# Patient Record
Sex: Male | Born: 1990 | Race: White | Hispanic: No | Marital: Single | Smoking: Current every day smoker
Health system: Southern US, Community
[De-identification: ages and names within clinical notes are randomized; demographics above are authoritative.]

## PROBLEM LIST (undated history)

## (undated) DIAGNOSIS — L03818 Cellulitis of other sites: Secondary | ICD-10-CM

## (undated) DIAGNOSIS — F319 Bipolar disorder, unspecified: Secondary | ICD-10-CM

## (undated) DIAGNOSIS — F200 Paranoid schizophrenia: Secondary | ICD-10-CM

## (undated) DIAGNOSIS — G40909 Epilepsy, unspecified, not intractable, without status epilepticus: Secondary | ICD-10-CM

## (undated) DIAGNOSIS — F329 Major depressive disorder, single episode, unspecified: Secondary | ICD-10-CM

## (undated) DIAGNOSIS — F32A Depression, unspecified: Secondary | ICD-10-CM

## (undated) DIAGNOSIS — R569 Unspecified convulsions: Secondary | ICD-10-CM

## (undated) HISTORY — PX: ORIF WRIST FRACTURE: SHX2133

---
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
## 2011-03-01 ENCOUNTER — Emergency Department (HOSPITAL_COMMUNITY)
Admission: EM | Admit: 2011-03-01 | Discharge: 2011-03-02 | Disposition: A | Discharge status: Home / Self Care | Payer: Medicaid Other | Attending: Emergency Medicine | Admitting: Emergency Medicine

## 2011-03-01 ENCOUNTER — Emergency Department (HOSPITAL_COMMUNITY): Payer: Medicaid Other

## 2011-03-01 DIAGNOSIS — Z8659 Personal history of other mental and behavioral disorders: Secondary | ICD-10-CM | POA: Insufficient documentation

## 2011-03-01 DIAGNOSIS — G8929 Other chronic pain: Secondary | ICD-10-CM | POA: Insufficient documentation

## 2011-03-01 DIAGNOSIS — R569 Unspecified convulsions: Secondary | ICD-10-CM | POA: Insufficient documentation

## 2011-03-01 DIAGNOSIS — Z79899 Other long term (current) drug therapy: Secondary | ICD-10-CM | POA: Insufficient documentation

## 2011-03-01 DIAGNOSIS — J329 Chronic sinusitis, unspecified: Secondary | ICD-10-CM | POA: Insufficient documentation

## 2011-03-01 DIAGNOSIS — M549 Dorsalgia, unspecified: Secondary | ICD-10-CM | POA: Insufficient documentation

## 2011-03-01 DIAGNOSIS — F319 Bipolar disorder, unspecified: Secondary | ICD-10-CM | POA: Insufficient documentation

## 2011-03-01 LAB — CBC
HCT: 46.1 % (ref 39.0–52.0)
Hemoglobin: 16.6 g/dL (ref 13.0–17.0)
MCH: 33.1 pg (ref 26.0–34.0)
MCHC: 36 g/dL (ref 30.0–36.0)
MCV: 91.8 fL (ref 78.0–100.0)
RDW: 12.5 % (ref 11.5–15.5)

## 2011-03-01 LAB — DIFFERENTIAL
Eosinophils Relative: 1 % (ref 0–5)
Lymphocytes Relative: 6 % — ABNORMAL LOW (ref 12–46)
Lymphs Abs: 1.4 10*3/uL (ref 0.7–4.0)
Monocytes Absolute: 0.8 10*3/uL (ref 0.1–1.0)
Monocytes Relative: 4 % (ref 3–12)
Neutro Abs: 19.8 10*3/uL — ABNORMAL HIGH (ref 1.7–7.7)

## 2011-03-01 LAB — URINALYSIS, ROUTINE W REFLEX MICROSCOPIC
Bilirubin Urine: NEGATIVE
Ketones, ur: NEGATIVE mg/dL
Nitrite: NEGATIVE
Specific Gravity, Urine: 1.019 (ref 1.005–1.030)
Urobilinogen, UA: 0.2 mg/dL (ref 0.0–1.0)
pH: 7 (ref 5.0–8.0)

## 2011-03-01 LAB — COMPREHENSIVE METABOLIC PANEL
ALT: 16 U/L (ref 0–53)
BUN: 9 mg/dL (ref 6–23)
CO2: 28 mEq/L (ref 19–32)
Calcium: 9.6 mg/dL (ref 8.4–10.5)
GFR calc non Af Amer: >60 mL/min (ref >60)
Glucose, Bld: 122 mg/dL — ABNORMAL HIGH (ref 70–99)
Sodium: 134 mEq/L — ABNORMAL LOW (ref 135–145)
Total Protein: 7.4 g/dL (ref 6.0–8.3)

## 2011-03-01 LAB — ETHANOL: Alcohol, Ethyl (B): <11 mg/dL — ABNORMAL HIGH (ref 0–10)

## 2011-03-02 ENCOUNTER — Inpatient Hospital Stay (HOSPITAL_COMMUNITY)
Admit: 2011-03-02 | Discharge: 2011-03-02 | Disposition: A | Discharge status: Home / Self Care | Payer: Medicaid Other | Attending: Emergency Medicine | Admitting: Emergency Medicine

## 2011-03-02 LAB — RAPID URINE DRUG SCREEN, HOSP PERFORMED
Amphetamines: NOT DETECTED
Barbiturates: NOT DETECTED
Benzodiazepines: NOT DETECTED
Cocaine: NOT DETECTED
Opiates: NOT DETECTED
Tetrahydrocannabinol: NOT DETECTED

## 2011-03-02 LAB — ETHANOL: Alcohol, Ethyl (B): <11 mg/dL — ABNORMAL HIGH (ref 0–10)

## 2011-03-02 NOTE — Procedures (Signed)
REFERRING PHYSICIAN:  Orlene Och, MD  HISTORY:  An 20 year old male with new-onset seizures.  MEDICATIONS:  Amitriptyline, fluoxetine, Seroquel, Xanax, Zofran, Ativan and Dilantin.  CONDITIONS OF RECORDING:  This is a 16-channel EEG carried out with the patient in the awake, drowsy and asleep states.  DESCRIPTION:  The waking background activity is only seen briefly and consists of a poorly sustained 10 Hz alpha activity seen from the parieto-occipital and posterotemporal regions.  Low-voltage fast activity poorly organized was seen anteriorly and at times superimposed on more posterior rhythms.  A mixture of theta and alpha rhythms can be seen from the central and temporal regions.  The patient drowses with slowing to irregular low-voltage theta and beta activity.  The patient goes into a light sleep with symmetrical sleep spindles, everted sharp activity and irregular slow activity.  During drowse, there is one instance where a sharp transient is noted over the right hemisphere with phase reversal at [facility].  Hypoventilation and intermittent photic stimulation were both performed.  Neither produced any abnormalities.  IMPRESSION:  This is an abnormal EEG with sharp transient noted at F4. This finding is consistent with the patient's history of seizure.          ______________________________ Thana Farr, MD    [id number] D:  03/02/2011 10:55:02  T:  03/02/2011 23:48:25  Job #:  [id number]

## 2011-03-25 ENCOUNTER — Emergency Department (HOSPITAL_COMMUNITY)
Admission: EM | Admit: 2011-03-25 | Discharge: 2011-03-26 | Disposition: A | Discharge status: Home / Self Care | Payer: Medicaid Other | Attending: Emergency Medicine | Admitting: Emergency Medicine

## 2011-03-25 DIAGNOSIS — Z79899 Other long term (current) drug therapy: Secondary | ICD-10-CM | POA: Insufficient documentation

## 2011-03-25 DIAGNOSIS — G40909 Epilepsy, unspecified, not intractable, without status epilepticus: Secondary | ICD-10-CM | POA: Insufficient documentation

## 2011-03-25 DIAGNOSIS — F319 Bipolar disorder, unspecified: Secondary | ICD-10-CM | POA: Insufficient documentation

## 2011-03-25 DIAGNOSIS — F209 Schizophrenia, unspecified: Secondary | ICD-10-CM | POA: Insufficient documentation

## 2011-03-25 DIAGNOSIS — F411 Generalized anxiety disorder: Secondary | ICD-10-CM | POA: Insufficient documentation

## 2011-03-25 DIAGNOSIS — R000 Tachycardia, unspecified: Secondary | ICD-10-CM | POA: Insufficient documentation

## 2011-03-25 LAB — BASIC METABOLIC PANEL
Chloride: 100 mEq/L (ref 96–112)
GFR calc Af Amer: >60 mL/min (ref >60)
GFR calc non Af Amer: >60 mL/min (ref >60)
Potassium: 3.6 mEq/L (ref 3.5–5.1)
Sodium: 137 mEq/L (ref 135–145)

## 2011-03-25 LAB — ACETAMINOPHEN LEVEL: Acetaminophen (Tylenol), Serum: <15 ug/mL (ref 10–30)

## 2011-03-25 LAB — SALICYLATE LEVEL: Salicylate Lvl: <2 mg/dL — ABNORMAL LOW (ref 2.8–20.0)

## 2011-03-26 LAB — RAPID URINE DRUG SCREEN, HOSP PERFORMED
Cocaine: NOT DETECTED
Opiates: NOT DETECTED

## 2011-04-03 ENCOUNTER — Other Ambulatory Visit: Payer: Self-pay | Admitting: Neurology

## 2011-04-03 DIAGNOSIS — F39 Unspecified mood [affective] disorder: Secondary | ICD-10-CM

## 2011-04-03 DIAGNOSIS — F1910 Other psychoactive substance abuse, uncomplicated: Secondary | ICD-10-CM

## 2011-04-03 DIAGNOSIS — R569 Unspecified convulsions: Secondary | ICD-10-CM

## 2011-04-03 DIAGNOSIS — F3110 Bipolar disorder, current episode manic without psychotic features, unspecified: Secondary | ICD-10-CM

## 2011-04-20 ENCOUNTER — Ambulatory Visit
Admission: RE | Admit: 2011-04-20 | Discharge: 2011-04-20 | Disposition: A | Discharge status: Home / Self Care | Payer: Medicaid Other | Source: Ambulatory Visit | Attending: Neurology | Admitting: Neurology

## 2011-04-20 DIAGNOSIS — F3110 Bipolar disorder, current episode manic without psychotic features, unspecified: Secondary | ICD-10-CM

## 2011-04-20 DIAGNOSIS — F1910 Other psychoactive substance abuse, uncomplicated: Secondary | ICD-10-CM

## 2011-04-20 DIAGNOSIS — F39 Unspecified mood [affective] disorder: Secondary | ICD-10-CM

## 2011-04-20 DIAGNOSIS — R569 Unspecified convulsions: Secondary | ICD-10-CM

## 2011-05-11 ENCOUNTER — Other Ambulatory Visit (HOSPITAL_COMMUNITY): Payer: Medicaid Other

## 2011-05-16 ENCOUNTER — Other Ambulatory Visit (HOSPITAL_COMMUNITY): Payer: Medicaid Other

## 2012-04-14 ENCOUNTER — Emergency Department (INDEPENDENT_AMBULATORY_CARE_PROVIDER_SITE_OTHER)
Admission: EM | Admit: 2012-04-14 | Discharge: 2012-04-14 | Disposition: A | Discharge status: Home / Self Care | Payer: Medicaid Other | Source: Home / Self Care | Attending: Emergency Medicine | Admitting: Emergency Medicine

## 2012-04-14 ENCOUNTER — Encounter (HOSPITAL_COMMUNITY): Payer: Self-pay | Admitting: *Deleted

## 2012-04-14 DIAGNOSIS — IMO0002 Reserved for concepts with insufficient information to code with codable children: Secondary | ICD-10-CM

## 2012-04-14 DIAGNOSIS — L02419 Cutaneous abscess of limb, unspecified: Secondary | ICD-10-CM

## 2012-04-14 HISTORY — DX: Depression, unspecified: F32.A

## 2012-04-14 HISTORY — DX: Epilepsy, unspecified, not intractable, without status epilepticus: G40.909

## 2012-04-14 HISTORY — DX: Major depressive disorder, single episode, unspecified: F32.9

## 2012-04-14 HISTORY — DX: Paranoid schizophrenia: F20.0

## 2012-04-14 HISTORY — DX: Bipolar disorder, unspecified: F31.9

## 2012-04-14 MED ORDER — SULFAMETHOXAZOLE-TRIMETHOPRIM 800-160 MG PO TABS: 1.0000 | ORAL_TABLET | Freq: Two times a day (BID) | ORAL | Status: AC

## 2012-04-14 MED ORDER — HYDROCODONE-ACETAMINOPHEN 5-325 MG PO TABS: 2.0000 | ORAL_TABLET | ORAL | Status: AC | PRN

## 2012-04-14 MED ORDER — CHLORHEXIDINE GLUCONATE 4 % EX LIQD: 60.0000 mL | Freq: Every day | CUTANEOUS | Status: AC | PRN

## 2012-04-14 MED ORDER — BACITRACIN 500 UNIT/GM EX OINT
1.0000 "application " | TOPICAL_OINTMENT | Freq: Once | CUTANEOUS | Status: AC
  Administered 2012-04-14: 1 via TOPICAL

## 2012-04-14 MED ORDER — IBUPROFEN 600 MG PO TABS: 600.0000 mg | ORAL_TABLET | Freq: Four times a day (QID) | ORAL | Status: AC | PRN

## 2012-04-14 MED ORDER — LIDOCAINE-EPINEPHRINE 2 %-1:100000 IJ SOLN
5.0000 mL | Freq: Once | INTRAMUSCULAR | Status: AC
  Administered 2012-04-14: 5 mL

## 2012-04-14 NOTE — ED Provider Notes (Signed)
History     CSN: [id number]  Arrival date & time 04/14/12  1133   First MD Initiated Contact with Patient 04/14/12 1155      Chief Complaint  Patient presents with  . Abscess    (Consider location/radiation/quality/duration/timing/severity/associated sxs/prior treatment) HPI Comments: Pt with painful erythematous mass of gradually increasing size in his left axilla starting 2 days ago. Pain is worse with palpation. Has been applying cool compresses, Bactroban, alcohol w/o relief. No N/V, fevers, drainage. No other lesions anywhere else.  Has not tried anything for pain. Has a history of axillary abscess the right side several years ago. He is not a diabetic.  ROS as noted in HPI. All other ROS negative.   Patient is a 21 y.o. male presenting with abscess.  Abscess  This is a new problem. The current episode started yesterday. The problem has been gradually worsening. Affected Location: Left axilla. The problem is moderate. The abscess is characterized by redness and painfulness. It is unknown what he was exposed to. The abscess first occurred at home. Pertinent negatives include no fever.    Past Medical History  Diagnosis Date  . Paranoid schizophrenia   . Seizure disorder   . Bipolar disorder   . Depression     Past Surgical History  Procedure Date  . Orif wrist fracture     History reviewed. No pertinent family history.  History  Substance Use Topics  . Smoking status: Never Smoker   . Smokeless tobacco: Not on file  . Alcohol Use: No      Review of Systems  Constitutional: Negative for fever.    Allergies  Amoxicillin and Penicillins  Home Medications   Current Outpatient Rx  Name Route Sig Dispense Refill  . PHENYTOIN SODIUM EXTENDED 100 MG PO CAPS Oral Take by mouth daily.    . QUETIAPINE FUMARATE 200 MG PO TABS Oral Take 200 mg by mouth 3 (three) times daily.    . CHLORHEXIDINE GLUCONATE 4 % EX LIQD Topical Apply 60 mLs (4 application total)  topically daily as needed. Use daily when bathing for 1-2 weeks 120 mL 0  . HYDROCODONE-ACETAMINOPHEN 5-325 MG PO TABS Oral Take 2 tablets by mouth every 4 (four) hours as needed for pain. 20 tablet 0  . IBUPROFEN 600 MG PO TABS Oral Take 1 tablet (600 mg total) by mouth every 6 (six) hours as needed for pain. 30 tablet 0  . SULFAMETHOXAZOLE-TRIMETHOPRIM 800-160 MG PO TABS Oral Take 1 tablet by mouth 2 (two) times daily. X 10 days 20 tablet 0    BP 134/90  Pulse 91  Temp 98.2 F (36.8 C) (Oral)  Resp 16  SpO2 96%  Physical Exam  Nursing note and vitals reviewed. Constitutional: He is oriented to person, place, and time. He appears well-developed and well-nourished.  HENT:  Head: Normocephalic and atraumatic.  Eyes: Conjunctivae and EOM are normal.  Neck: Normal range of motion.  Cardiovascular: Normal rate.   Pulmonary/Chest: Effort normal. No respiratory distress.  Abdominal: He exhibits no distension.  Musculoskeletal: Normal range of motion.  Neurological: He is alert and oriented to person, place, and time. Coordination normal.  Skin: Skin is warm and dry.       1.5 x 3 cm erythematous, tender mass with central fluctuance and left axilla. No expressible drainage. Some surrounding axillary lymphadenopathy.  Psychiatric: He has a normal mood and affect. His behavior is normal. Judgment and thought content normal.    ED Course  INCISION AND  DRAINAGE Date/Time: 04/14/2012 1:17 PM Performed by: Luiz Blare Authorized by: Luiz Blare Consent: Verbal consent obtained. Risks and benefits: risks, benefits and alternatives were discussed Consent given by: patient Patient understanding: patient states understanding of the procedure being performed Patient consent: the patient's understanding of the procedure matches consent given Required items: required blood products, implants, devices, and special equipment available Patient identity confirmed: verbally with  patient Time out: Immediately prior to procedure a "time out" was called to verify the correct patient, procedure, equipment, support staff and site/side marked as required. Type: abscess Body area: upper extremity (Left axilla) Anesthesia: local infiltration Local anesthetic: lidocaine 2% with epinephrine Anesthetic total: 3 ml Patient sedated: no Scalpel size: 11 Incision type: Cruciate. Complexity: simple Drainage: purulent Drainage amount: copious Wound treatment: wound left open Patient tolerance: Patient tolerated the procedure well with no immediate complications. Comments: Perform blunt dissection to break up loculations. Obtained deep wound culture. Sending this off. Irrigated the cavity with 8 mL of lidocaine with epi 2%. Applied bacitracin and sterile pressure dressing.   (including critical care time)   Labs Reviewed  CULTURE, ROUTINE-ABSCESS   No results found.   1. Axillary abscess      MDM  With Bactrim, ibuprofen, Norco. He'll followup here or with his Dr. in several days for a wound check if he does not significantly improve. Patient and parent agreed with plan.  Luiz Blare, MD 04/14/12 1319

## 2012-04-14 NOTE — ED Notes (Signed)
C/O left axillary abscess x 2 days.  Redness noted.  Denies fevers.  Has had axillary abscess once in past, approx 4-5 yrs ago.

## 2012-04-16 LAB — CULTURE, ROUTINE-ABSCESS

## 2012-04-18 ENCOUNTER — Telehealth (HOSPITAL_COMMUNITY): Payer: Self-pay | Admitting: *Deleted

## 2012-04-18 NOTE — ED Notes (Signed)
Abscess culture L axilla:  Abundant MRSA.  Pt. adequately treated with Septra DS. I called and left a message for pt. to call. Vassie Moselle 04/18/2012

## 2012-04-24 ENCOUNTER — Telehealth (HOSPITAL_COMMUNITY): Payer: Self-pay | Admitting: *Deleted

## 2012-04-25 ENCOUNTER — Telehealth (HOSPITAL_COMMUNITY): Payer: Self-pay | Admitting: *Deleted

## 2012-04-25 NOTE — ED Notes (Signed)
I called pt.  Pt. verified x 2 and given result.  Pt. told he was adequately treated with Septra DS.  Pt. Given MRSA instructions and voiced understanding. Vassie Moselle 04/25/2012

## 2012-04-26 ENCOUNTER — Telehealth (HOSPITAL_COMMUNITY): Payer: Self-pay | Admitting: *Deleted

## 2012-04-26 NOTE — ED Notes (Signed)
Male family member called and said someone from here called earlier today about billing. I asked Clyda Hurdle if she had called and she did not.  He said we should be billing Medicaid. I referred him to the billing office @ [phone]. I told him I had called earlier, but it was to give the pt. his lab results. Joel Randolph 04/26/2012

## 2012-05-16 ENCOUNTER — Encounter (HOSPITAL_COMMUNITY): Payer: Self-pay | Admitting: Emergency Medicine

## 2012-05-16 ENCOUNTER — Emergency Department (HOSPITAL_COMMUNITY)
Admission: EM | Admit: 2012-05-16 | Discharge: 2012-05-16 | Disposition: A | Discharge status: Home / Self Care | Payer: Medicaid Other | Attending: Emergency Medicine | Admitting: Emergency Medicine

## 2012-05-16 DIAGNOSIS — F319 Bipolar disorder, unspecified: Secondary | ICD-10-CM | POA: Insufficient documentation

## 2012-05-16 DIAGNOSIS — Z880 Allergy status to penicillin: Secondary | ICD-10-CM | POA: Insufficient documentation

## 2012-05-16 DIAGNOSIS — R7889 Finding of other specified substances, not normally found in blood: Secondary | ICD-10-CM

## 2012-05-16 DIAGNOSIS — E876 Hypokalemia: Secondary | ICD-10-CM

## 2012-05-16 DIAGNOSIS — F200 Paranoid schizophrenia: Secondary | ICD-10-CM | POA: Insufficient documentation

## 2012-05-16 DIAGNOSIS — R29818 Other symptoms and signs involving the nervous system: Secondary | ICD-10-CM

## 2012-05-16 DIAGNOSIS — G40909 Epilepsy, unspecified, not intractable, without status epilepticus: Secondary | ICD-10-CM | POA: Insufficient documentation

## 2012-05-16 HISTORY — DX: Unspecified convulsions: R56.9

## 2012-05-16 LAB — COMPREHENSIVE METABOLIC PANEL
AST: 20 U/L (ref 0–37)
Albumin: 4.1 g/dL (ref 3.5–5.2)
BUN: 9 mg/dL (ref 6–23)
Chloride: 100 mEq/L (ref 96–112)
GFR calc Af Amer: >90 mL/min (ref >90)
GFR calc non Af Amer: >90 mL/min (ref >90)
Potassium: 3.2 mEq/L — ABNORMAL LOW (ref 3.5–5.1)

## 2012-05-16 LAB — RAPID URINE DRUG SCREEN, HOSP PERFORMED
Amphetamines: NOT DETECTED
Benzodiazepines: POSITIVE — AB
Cocaine: NOT DETECTED
Opiates: NOT DETECTED

## 2012-05-16 LAB — CBC WITH DIFFERENTIAL/PLATELET
HCT: 44.9 % (ref 39.0–52.0)
Hemoglobin: 16.5 g/dL (ref 13.0–17.0)
Lymphocytes Relative: 26 % (ref 12–46)
Lymphs Abs: 2.5 10*3/uL (ref 0.7–4.0)
Monocytes Absolute: 0.8 10*3/uL (ref 0.1–1.0)
Monocytes Relative: 9 % (ref 3–12)
Neutro Abs: 5.9 10*3/uL (ref 1.7–7.7)
Neutrophils Relative %: 61 % (ref 43–77)
RBC: 5.04 MIL/uL (ref 4.22–5.81)

## 2012-05-16 LAB — URINALYSIS, ROUTINE W REFLEX MICROSCOPIC
Glucose, UA: NEGATIVE mg/dL
Hgb urine dipstick: NEGATIVE
Leukocytes, UA: NEGATIVE
Protein, ur: NEGATIVE mg/dL
Specific Gravity, Urine: 1.026 (ref 1.005–1.030)
pH: 6 (ref 5.0–8.0)

## 2012-05-16 LAB — GLUCOSE, CAPILLARY: Glucose-Capillary: 99 mg/dL (ref 70–99)

## 2012-05-16 MED ORDER — DIPHENHYDRAMINE HCL 50 MG/ML IJ SOLN
25.0000 mg | Freq: Once | INTRAMUSCULAR | Status: AC
  Administered 2012-05-16: 25 mg via INTRAVENOUS
  Filled 2012-05-16: qty 1

## 2012-05-16 MED ORDER — POTASSIUM CHLORIDE CRYS ER 20 MEQ PO TBCR
40.0000 meq | EXTENDED_RELEASE_TABLET | Freq: Once | ORAL | Status: AC
  Administered 2012-05-16: 40 meq via ORAL
  Filled 2012-05-16: qty 2

## 2012-05-16 MED ORDER — PHENYTOIN SODIUM EXTENDED 100 MG PO CAPS
200.0000 mg | ORAL_CAPSULE | Freq: Once | ORAL | Status: AC
  Administered 2012-05-16: 200 mg via ORAL
  Filled 2012-05-16: qty 2

## 2012-05-16 MED ORDER — SODIUM CHLORIDE 0.9 % IV SOLN
Freq: Once | INTRAVENOUS | Status: AC
  Administered 2012-05-16: 02:00:00 via INTRAVENOUS

## 2012-05-16 MED ORDER — SODIUM CHLORIDE 0.9 % IV SOLN
1000.0000 mg | Freq: Once | INTRAVENOUS | Status: DC
  Administered 2012-05-16: 1000 mg via INTRAVENOUS
  Filled 2012-05-16: qty 20

## 2012-05-16 MED ORDER — MORPHINE SULFATE 4 MG/ML IJ SOLN
4.0000 mg | Freq: Once | INTRAMUSCULAR | Status: AC
  Administered 2012-05-16: 4 mg via INTRAVENOUS
  Filled 2012-05-16: qty 1

## 2012-05-16 NOTE — ED Provider Notes (Signed)
History     CSN: [id number]  Arrival date & time 05/16/12  0049   First MD Initiated Contact with Patient 05/16/12 [phone]      Chief Complaint  Patient presents with  . Seizures    (Consider location/radiation/quality/duration/timing/severity/associated sxs/prior treatment) HPI Comments: Patient is a 21 year old male with a history of epileptic seizures, paranoid schizophrenia, bipolar disorder, and depression that presents emergency department with chief complaint of aura.  History is obtained from both patient and his family.  They report that the patient typically develops diaphoresis, heart palpitations, head tingles and funny taste in his mouth prior to seizures which he developed at around 11 p.m. this evening.  At that time EMS was called and gave him a shot of Ativan.  Patient states that he did not develop a seizure.  Patient recently  change the way he takes his Dilantin originally taking it 3 pills at night and then today began taking it 3 times throughout the day.  Patient denies any cough, fever, night sweats, chills, cough, hemoptysis, difficulty breathing, chest pain, urinary symptoms.  Patient does report increased fatigue and decrease in sleep.  There are no Stressors.  No other complaints at this time.    Patient is a 21 y.o. male presenting with seizures.  Seizures     Past Medical History  Diagnosis Date  . Paranoid schizophrenia   . Seizure disorder   . Bipolar disorder   . Depression   . Seizures     Past Surgical History  Procedure Date  . Orif wrist fracture     History reviewed. No pertinent family history.  History  Substance Use Topics  . Smoking status: Never Smoker   . Smokeless tobacco: Not on file  . Alcohol Use: No      Review of Systems  All other systems reviewed and are negative.    Allergies  Amoxicillin and Penicillins  Home Medications   Current Outpatient Rx  Name Route Sig Dispense Refill  . ALPRAZOLAM 1 MG PO TABS Oral  Take 1 mg by mouth at bedtime as needed. Nausea    . PHENYTOIN SODIUM EXTENDED 100 MG PO CAPS Oral Take 100 mg by mouth 3 (three) times daily.     . QUETIAPINE FUMARATE 200 MG PO TABS Oral Take 200 mg by mouth 3 (three) times daily.       BP 139/69  Pulse 84  Temp 98.2 F (36.8 C) (Oral)  Resp 16  Wt 153 lb (69.4 kg)  SpO2 99%  Physical Exam  Nursing note and vitals reviewed. Constitutional: He appears well-developed and well-nourished. No distress.          HENT:  Head: Normocephalic.       Poor dentition. Atraumatic, No evidence of tongue oral lacerations  Eyes: EOM are normal. Pupils are equal, round, and reactive to light.  Neck: Normal range of motion. Neck supple.       Cervical spinous process non tender without step offs, no difficulty or pain with flexion or extension of neck  Cardiovascular: Regular rhythm, normal heart sounds and intact distal pulses.        tachy  Pulmonary/Chest: Breath sounds normal. No respiratory distress. He has no wheezes. He has no rales.  Abdominal: Soft. There is no tenderness.  Musculoskeletal: He exhibits no edema and no tenderness.       Full active & passive ROM of arms bilaterally  Neurological:       CN III-VII intact. Iintact coordination,  sensation, and motor (finger grip, biceps, hamstrings & dorsiflexion). No pass pointing, good rapid coordination. Gait normal.   Skin: Skin is warm and dry. He is not diaphoretic.       intact    ED Course  Procedures (including critical care time)  Labs Reviewed  COMPREHENSIVE METABOLIC PANEL - Abnormal; Notable for the following:    Potassium 3.2 (*)     Total Bilirubin 0.2 (*)     All other components within normal limits  CBC WITH DIFFERENTIAL - Abnormal; Notable for the following:    MCHC 36.7 (*)     All other components within normal limits  PHENYTOIN LEVEL, TOTAL - Abnormal; Notable for the following:    Phenytoin Lvl 4.5 (*)     All other components within normal limits  URINE  RAPID DRUG SCREEN (HOSP PERFORMED) - Abnormal; Notable for the following:    Benzodiazepines POSITIVE (*)     All other components within normal limits  URINALYSIS, ROUTINE W REFLEX MICROSCOPIC  ETHANOL  GLUCOSE, CAPILLARY   No results found.   No diagnosis found.    MDM  Seizure Hx, aura   Patient with no evidence of focal neuro deficits on physical exam and is at mental baseline.  Labs and imaging have been reviewed.  Dilantin level low, patient given loading dose & potassium in the emergency department. Patient is advised to followup with neurologist in regards to today's event.  Spoke with patient and family in detail about driving restrictions until cleared by a neurologist.  Patient verbalizes understanding.  Answered all questions.  Patient is hemodynamically stable and in no acute distress prior to discharge.           Jaci Carrel, [place] 05/16/12 [phone]

## 2012-05-16 NOTE — ED Notes (Addendum)
Pt states that his Dilantin has been changed from 3 pills at night to 3 pills through the day and has felt seizure like activity today. Heart racing. Tingling down his arms. CBG 130. 18g L arm.

## 2012-05-16 NOTE — ED Notes (Signed)
[id number]<VW> Expected date:<BR> Expected time:<BR> Means of arrival:Ambulance<BR> Comments:<BR> EMS

## 2012-05-16 NOTE — ED Notes (Signed)
Pt alert, arrives from home, c/o ? Seizure, onset was this evening, lasting 1hr, per family pt was sweating, resp even unlabored, skin pwd, pt did not lose bowel or bladder control

## 2012-05-17 NOTE — ED Provider Notes (Signed)
Medical screening examination/treatment/procedure(s) were performed by non-physician practitioner and as supervising physician I was immediately available for consultation/collaboration.  Kaleisha Bhargava, MD 05/17/12 0847 

## 2012-11-13 ENCOUNTER — Emergency Department (HOSPITAL_COMMUNITY)
Admission: EM | Admit: 2012-11-13 | Discharge: 2012-11-13 | Disposition: A | Discharge status: Home / Self Care | Payer: Medicaid Other | Attending: Emergency Medicine | Admitting: Emergency Medicine

## 2012-11-13 ENCOUNTER — Emergency Department (HOSPITAL_COMMUNITY): Payer: Medicaid Other

## 2012-11-13 ENCOUNTER — Encounter (HOSPITAL_COMMUNITY): Payer: Self-pay | Admitting: *Deleted

## 2012-11-13 DIAGNOSIS — Y93K1 Activity, walking an animal: Secondary | ICD-10-CM | POA: Insufficient documentation

## 2012-11-13 DIAGNOSIS — S61459A Open bite of unspecified hand, initial encounter: Secondary | ICD-10-CM

## 2012-11-13 DIAGNOSIS — F329 Major depressive disorder, single episode, unspecified: Secondary | ICD-10-CM | POA: Insufficient documentation

## 2012-11-13 DIAGNOSIS — F319 Bipolar disorder, unspecified: Secondary | ICD-10-CM | POA: Insufficient documentation

## 2012-11-13 DIAGNOSIS — Z203 Contact with and (suspected) exposure to rabies: Secondary | ICD-10-CM

## 2012-11-13 DIAGNOSIS — S61409A Unspecified open wound of unspecified hand, initial encounter: Secondary | ICD-10-CM | POA: Insufficient documentation

## 2012-11-13 DIAGNOSIS — F3289 Other specified depressive episodes: Secondary | ICD-10-CM | POA: Insufficient documentation

## 2012-11-13 DIAGNOSIS — Y9289 Other specified places as the place of occurrence of the external cause: Secondary | ICD-10-CM | POA: Insufficient documentation

## 2012-11-13 DIAGNOSIS — Z79899 Other long term (current) drug therapy: Secondary | ICD-10-CM | POA: Insufficient documentation

## 2012-11-13 DIAGNOSIS — Z8669 Personal history of other diseases of the nervous system and sense organs: Secondary | ICD-10-CM | POA: Insufficient documentation

## 2012-11-13 DIAGNOSIS — Z23 Encounter for immunization: Secondary | ICD-10-CM | POA: Insufficient documentation

## 2012-11-13 DIAGNOSIS — F200 Paranoid schizophrenia: Secondary | ICD-10-CM | POA: Insufficient documentation

## 2012-11-13 DIAGNOSIS — W540XXA Bitten by dog, initial encounter: Secondary | ICD-10-CM | POA: Insufficient documentation

## 2012-11-13 MED ORDER — LORAZEPAM 2 MG/ML IJ SOLN
1.0000 mg | Freq: Once | INTRAMUSCULAR | Status: AC
  Administered 2012-11-13: 1 mg via INTRAVENOUS
  Filled 2012-11-13: qty 1

## 2012-11-13 MED ORDER — TETANUS-DIPHTH-ACELL PERTUSSIS 5-2.5-18.5 LF-MCG/0.5 IM SUSP
0.5000 mL | Freq: Once | INTRAMUSCULAR | Status: AC
  Administered 2012-11-13: 0.5 mL via INTRAMUSCULAR
  Filled 2012-11-13: qty 0.5

## 2012-11-13 MED ORDER — ONDANSETRON HCL 4 MG/2ML IJ SOLN
INTRAMUSCULAR | Status: AC
  Administered 2012-11-13: 4 mg via INTRAVENOUS
  Filled 2012-11-13: qty 2

## 2012-11-13 MED ORDER — OXYCODONE-ACETAMINOPHEN 5-325 MG PO TABS: 1.0000 | ORAL_TABLET | Freq: Four times a day (QID) | ORAL | Status: DC | PRN

## 2012-11-13 MED ORDER — RABIES IMMUNE GLOBULIN 150 UNIT/ML IM INJ
20.0000 [IU]/kg | INJECTION | Freq: Once | INTRAMUSCULAR | Status: AC
  Administered 2012-11-13: 1425 [IU] via INTRAMUSCULAR
  Filled 2012-11-13: qty 9.5

## 2012-11-13 MED ORDER — DOXYCYCLINE HYCLATE 100 MG PO CAPS: 100.0000 mg | ORAL_CAPSULE | Freq: Two times a day (BID) | ORAL | Status: DC

## 2012-11-13 MED ORDER — HYDROMORPHONE HCL PF 1 MG/ML IJ SOLN
1.0000 mg | Freq: Once | INTRAMUSCULAR | Status: AC
  Administered 2012-11-13: 1 mg via INTRAVENOUS
  Filled 2012-11-13: qty 1

## 2012-11-13 MED ORDER — OXYCODONE-ACETAMINOPHEN 5-325 MG PO TABS: 2.0000 | ORAL_TABLET | Freq: Once | ORAL | Status: DC

## 2012-11-13 MED ORDER — ONDANSETRON HCL 4 MG/2ML IJ SOLN
4.0000 mg | Freq: Once | INTRAMUSCULAR | Status: AC
  Administered 2012-11-13: 4 mg via INTRAVENOUS

## 2012-11-13 MED ORDER — RABIES VACCINE, PCEC IM SUSR
1.0000 mL | Freq: Once | INTRAMUSCULAR | Status: AC
  Administered 2012-11-13: 1 mL via INTRAMUSCULAR
  Filled 2012-11-13: qty 1

## 2012-11-13 MED ORDER — DOXYCYCLINE HYCLATE 100 MG PO TABS
100.0000 mg | ORAL_TABLET | Freq: Once | ORAL | Status: AC
Start: 2012-11-13 — End: 2012-11-13
  Administered 2012-11-13: 100 mg via ORAL
  Filled 2012-11-13: qty 1
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
## 2012-11-13 NOTE — ED Notes (Addendum)
Pt received x 2 IM injections in the R anterior thigh each 2 mL Hyperab, pt received x 2 IM injections in the L anterior thigh each 2 mL Hyperab, pt received x 1.5 mL Hyperab in L anterior thigh, pt tolerated procedure well, pt given printed schedule for future immunoglobulin follow up injections

## 2012-11-13 NOTE — ED Provider Notes (Signed)
Medical screening examination/treatment/procedure(s) were performed by non-physician practitioner and as supervising physician I was immediately available for consultation/collaboration.   Carleene Cooper III, MD 11/13/12 2025

## 2012-11-13 NOTE — ED Notes (Signed)
Shot schedule for remainder of rabies vaccines completed and faxed to [facility], [facility] and to pharmacy x 2.  Bite report faxed to [facility].
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
## 2012-11-16 ENCOUNTER — Encounter (HOSPITAL_COMMUNITY): Payer: Self-pay | Admitting: Emergency Medicine

## 2012-11-16 ENCOUNTER — Emergency Department (INDEPENDENT_AMBULATORY_CARE_PROVIDER_SITE_OTHER)
Admission: EM | Admit: 2012-11-16 | Discharge: 2012-11-16 | Disposition: A | Discharge status: Home / Self Care | Payer: Medicaid Other | Source: Home / Self Care | Attending: Family Medicine | Admitting: Family Medicine

## 2012-11-16 DIAGNOSIS — Z203 Contact with and (suspected) exposure to rabies: Secondary | ICD-10-CM

## 2012-11-16 DIAGNOSIS — S61451D Open bite of right hand, subsequent encounter: Secondary | ICD-10-CM

## 2012-11-16 MED ORDER — RABIES VACCINE, PCEC IM SUSR
INTRAMUSCULAR | Status: AC
  Filled 2012-11-16: qty 1

## 2012-11-16 MED ORDER — RABIES VACCINE, PCEC IM SUSR
1.0000 mL | Freq: Once | INTRAMUSCULAR | Status: AC
  Administered 2012-11-16: 1 mL via INTRAMUSCULAR

## 2012-11-16 NOTE — ED Notes (Signed)
Pt is here for rabies injection and pt is requesting to see doctor because of pain from dog bites. Pt has no other concerns.

## 2012-11-16 NOTE — ED Provider Notes (Signed)
History     CSN: [id number]  Arrival date & time 11/16/12  1122   First MD Initiated Contact with Patient 11/16/12 1153      Chief Complaint  Patient presents with  . Rabies Injection    pt also requesting to see doc because of pain from dog bites.    (Consider location/radiation/quality/duration/timing/severity/associated sxs/prior treatment) Patient is a 22 y.o. male presenting with wound check. The history is provided by the patient and a parent.  Wound Check This is a new problem. The current episode started more than 2 days ago. The problem has been gradually improving. Associated symptoms comments: Dog bites and abrasions from 2/5 , here for 2nd rabies inj and recheck..    Past Medical History  Diagnosis Date  . Paranoid schizophrenia   . Seizure disorder   . Bipolar disorder   . Depression   . Seizures     Past Surgical History  Procedure Laterality Date  . Orif wrist fracture      History reviewed. No pertinent family history.  History  Substance Use Topics  . Smoking status: Never Smoker   . Smokeless tobacco: Not on file  . Alcohol Use: No      Review of Systems  Constitutional: Negative.   Skin: Positive for wound.    Allergies  Amoxicillin and Penicillins  Home Medications   Current Outpatient Rx  Name  Route  Sig  Dispense  Refill  . ALPRAZolam (XANAX) 1 MG tablet   Oral   Take 1 mg by mouth at bedtime as needed. Nausea         . doxycycline (VIBRAMYCIN) 100 MG capsule   Oral   Take 1 capsule (100 mg total) by mouth 2 (two) times daily.   20 capsule   0   . omeprazole (PRILOSEC) 20 MG capsule   Oral   Take 20 mg by mouth daily.         Marland Kitchen oxyCODONE-acetaminophen (PERCOCET/ROXICET) 5-325 MG per tablet   Oral   Take 1-2 tablets by mouth every 6 (six) hours as needed for pain.   20 tablet   0   . phenytoin (DILANTIN) 100 MG ER capsule   Oral   Take 200-300 mg by mouth 3 (three) times daily. 2 capsules by mouth in the morning  and 3 capsules in the evening.         Marland Kitchen QUEtiapine (SEROQUEL) 200 MG tablet   Oral   Take 200 mg by mouth 3 (three) times daily.          . sertraline (ZOLOFT) 50 MG tablet   Oral   Take 50 mg by mouth at bedtime.         . traZODone (DESYREL) 100 MG tablet   Oral   Take 100 mg by mouth at bedtime as needed. As needed for insomnia.         Marland Kitchen albuterol (PROVENTIL HFA;VENTOLIN HFA) 108 (90 BASE) MCG/ACT inhaler   Inhalation   Inhale 2 puffs into the lungs every 6 (six) hours as needed. As needed for shortness of breath.           BP 118/79  Pulse 84  Temp(Src) 98.6 F (37 C) (Oral)  SpO2 99%  Physical Exam  Nursing note and vitals reviewed. Constitutional: He is oriented to person, place, and time. He appears well-developed and well-nourished.  Musculoskeletal: Normal range of motion. He exhibits tenderness.  Neurological: He is alert and oriented to person, place, and  time.  Skin: Skin is warm and dry. No erythema.  All wounds healing well, no cellulitis or drainage.    ED Course  Procedures (including critical care time)  Labs Reviewed - No data to display No results found.   1. Dog bite of hand without complication, right, subsequent encounter       MDM  Wounds healing well, no signs of infection.       Linna Hoff, MD 11/16/12 [phone]

## 2012-11-20 ENCOUNTER — Encounter (HOSPITAL_COMMUNITY): Payer: Self-pay | Admitting: *Deleted

## 2012-11-20 ENCOUNTER — Emergency Department (INDEPENDENT_AMBULATORY_CARE_PROVIDER_SITE_OTHER)
Admission: EM | Admit: 2012-11-20 | Discharge: 2012-11-20 | Disposition: A | Discharge status: Home / Self Care | Payer: Medicaid Other | Source: Home / Self Care | Attending: Emergency Medicine | Admitting: Emergency Medicine

## 2012-11-20 DIAGNOSIS — Z203 Contact with and (suspected) exposure to rabies: Secondary | ICD-10-CM

## 2012-11-20 MED ORDER — RABIES VACCINE, PCEC IM SUSR
INTRAMUSCULAR | Status: AC
  Filled 2012-11-20: qty 1

## 2012-11-20 MED ORDER — RABIES VACCINE, PCEC IM SUSR
1.0000 mL | Freq: Once | INTRAMUSCULAR | Status: AC
Start: 2012-11-20 — End: 2012-11-20
  Administered 2012-11-20: 1 mL via INTRAMUSCULAR

## 2012-11-20 NOTE — ED Notes (Signed)
For Rabies vaccine #3.

## 2012-11-27 ENCOUNTER — Encounter (HOSPITAL_COMMUNITY): Payer: Self-pay

## 2012-11-27 ENCOUNTER — Emergency Department (INDEPENDENT_AMBULATORY_CARE_PROVIDER_SITE_OTHER)
Admission: EM | Admit: 2012-11-27 | Discharge: 2012-11-27 | Disposition: A | Discharge status: Home / Self Care | Payer: Medicaid Other | Source: Home / Self Care

## 2012-11-27 DIAGNOSIS — Z23 Encounter for immunization: Secondary | ICD-10-CM

## 2012-11-27 MED ORDER — RABIES VACCINE, PCEC IM SUSR
1.0000 mL | Freq: Once | INTRAMUSCULAR | Status: AC
  Administered 2012-11-27: 1 mL via INTRAMUSCULAR

## 2012-11-27 MED ORDER — RABIES VACCINE, PCEC IM SUSR
INTRAMUSCULAR | Status: AC
  Filled 2012-11-27: qty 1

## 2012-11-27 NOTE — ED Notes (Signed)
Here for #4 injection in series ; NAD, denies issues w prior injections

## 2013-03-24 ENCOUNTER — Other Ambulatory Visit: Payer: Self-pay | Admitting: Family Medicine

## 2013-03-24 ENCOUNTER — Ambulatory Visit
Admission: RE | Admit: 2013-03-24 | Discharge: 2013-03-24 | Disposition: A | Discharge status: Home / Self Care | Payer: Medicaid Other | Source: Ambulatory Visit | Attending: Family Medicine | Admitting: Family Medicine

## 2013-03-24 DIAGNOSIS — M545 Low back pain, unspecified: Secondary | ICD-10-CM

## 2013-05-16 ENCOUNTER — Emergency Department (INDEPENDENT_AMBULATORY_CARE_PROVIDER_SITE_OTHER)
Admission: EM | Admit: 2013-05-16 | Discharge: 2013-05-16 | Disposition: A | Discharge status: Home / Self Care | Payer: Medicaid Other | Source: Home / Self Care | Attending: Family Medicine | Admitting: Family Medicine

## 2013-05-16 ENCOUNTER — Encounter (HOSPITAL_COMMUNITY): Payer: Self-pay | Admitting: Emergency Medicine

## 2013-05-16 DIAGNOSIS — J340 Abscess, furuncle and carbuncle of nose: Secondary | ICD-10-CM

## 2013-05-16 DIAGNOSIS — L03211 Cellulitis of face: Secondary | ICD-10-CM

## 2013-05-16 MED ORDER — CEPHALEXIN 500 MG PO CAPS: 500.0000 mg | ORAL_CAPSULE | Freq: Four times a day (QID) | ORAL | Status: DC

## 2013-05-16 MED ORDER — MUPIROCIN CALCIUM 2 % NA OINT: TOPICAL_OINTMENT | NASAL | Status: DC

## 2013-05-16 MED ORDER — TRAMADOL HCL 50 MG PO TABS: 50.0000 mg | ORAL_TABLET | Freq: Four times a day (QID) | ORAL | Status: DC | PRN

## 2013-05-16 NOTE — ED Notes (Signed)
Pt c/o swollen nose onset 3 days sxs include: tenderness, redness, swelling; 2 small bumps inside each nostril Reports he is taking doxycycline PCP gave him for 2 knots on side of head; also taking benadryl, ibup w/no relief. Alert w/no signs of acute distress.

## 2013-05-16 NOTE — ED Provider Notes (Signed)
CSN: [id number]     Arrival date & time 05/16/13  1107 History     First MD Initiated Contact with Patient 05/16/13 1121     Chief Complaint  Patient presents with  . Nose Problem   (Consider location/radiation/quality/duration/timing/severity/associated sxs/prior Treatment) Patient is a 22 y.o. male presenting with rash. The history is provided by the patient and a parent.  Rash Pain location: tpi of nose. Pain quality: throbbing   Onset quality:  Gradual Duration:  3 days Chronicity:  New Context comment:  Nasal soreness Associated symptoms: no fever     Past Medical History  Diagnosis Date  . Paranoid schizophrenia   . Seizure disorder   . Bipolar disorder   . Depression   . Seizures    Past Surgical History  Procedure Laterality Date  . Orif wrist fracture     Family History  Problem Relation Age of Onset  . Panic disorder Mother   . Depression Mother   . Depression Father   . Schizophrenia Father   . COPD Father   . Bipolar disorder Father    History  Substance Use Topics  . Smoking status: Current Every Day Smoker -- 1.00 packs/day for 10 years    Types: Cigarettes  . Smokeless tobacco: Not on file  . Alcohol Use: No    Review of Systems  Constitutional: Negative.  Negative for fever.  Skin: Positive for rash.    Allergies  Amoxicillin and Penicillins  Home Medications   Current Outpatient Rx  Name  Route  Sig  Dispense  Refill  . doxycycline (VIBRAMYCIN) 100 MG capsule   Oral   Take 1 capsule (100 mg total) by mouth 2 (two) times daily.   20 capsule   0   . phenytoin (DILANTIN) 100 MG ER capsule   Oral   Take 200-300 mg by mouth 3 (three) times daily. 2 capsules by mouth in the morning and 3 capsules in the evening.         . promethazine (PHENERGAN) 25 MG tablet   Oral   Take 25 mg by mouth every 6 (six) hours as needed for nausea.         Marland Kitchen QUEtiapine (SEROQUEL) 200 MG tablet   Oral   Take 200 mg by mouth 3 (three) times  daily.          . sertraline (ZOLOFT) 50 MG tablet   Oral   Take 50 mg by mouth at bedtime.         . traZODone (DESYREL) 100 MG tablet   Oral   Take 100 mg by mouth at bedtime as needed. As needed for insomnia.         Marland Kitchen albuterol (PROVENTIL HFA;VENTOLIN HFA) 108 (90 BASE) MCG/ACT inhaler   Inhalation   Inhale 2 puffs into the lungs every 6 (six) hours as needed. As needed for shortness of breath.         . ALPRAZolam (XANAX) 1 MG tablet   Oral   Take 1 mg by mouth at bedtime as needed. Nausea         . cephALEXin (KEFLEX) 500 MG capsule   Oral   Take 1 capsule (500 mg total) by mouth 4 (four) times daily. Take all of medicine and drink lots of fluids   28 capsule   0   . cyclobenzaprine (FLEXERIL) 10 MG tablet   Oral   Take 10 mg by mouth 3 (three) times daily as needed for muscle  spasms.         . mupirocin nasal ointment (BACTROBAN) 2 %      Apply in each nostril 3 times a day after warm soak   10 g   1   . omeprazole (PRILOSEC) 20 MG capsule   Oral   Take 20 mg by mouth daily.         Marland Kitchen oxyCODONE-acetaminophen (PERCOCET/ROXICET) 5-325 MG per tablet   Oral   Take 1-2 tablets by mouth every 6 (six) hours as needed for pain.   20 tablet   0   . traMADol (ULTRAM) 50 MG tablet   Oral   Take 1 tablet (50 mg total) by mouth every 6 (six) hours as needed for pain.   15 tablet   0    BP 128/80  Pulse 86  Temp(Src) 98.1 F (36.7 C) (Oral)  Resp 16  SpO2 100% Physical Exam  Nursing note and vitals reviewed. Constitutional: He appears well-developed and well-nourished.  HENT:  Head: Normocephalic.  Right Ear: External ear normal.  Nose: Sinus tenderness present.    Mouth/Throat: Oropharynx is clear and moist.    ED Course   Procedures (including critical care time)  Labs Reviewed - No data to display No results found. 1. Cellulitis of nasal tip     MDM    Linna Hoff, MD 05/16/13 1137

## 2013-05-16 NOTE — ED Notes (Addendum)
Pt left upset and stated that the Doctor was a "jerk" as he was leaving b/c he did not receive any pain meds... Tried to explain to pt that tramadol is for pain but pt wanted something stronger.

## 2013-10-13 DIAGNOSIS — Z0289 Encounter for other administrative examinations: Secondary | ICD-10-CM

## 2013-10-28 ENCOUNTER — Emergency Department (HOSPITAL_COMMUNITY)
Admission: EM | Admit: 2013-10-28 | Discharge: 2013-10-28 | Disposition: A | Discharge status: Home / Self Care | Payer: Medicaid Other | Attending: Emergency Medicine | Admitting: Emergency Medicine

## 2013-10-28 ENCOUNTER — Encounter (HOSPITAL_COMMUNITY): Payer: Self-pay | Admitting: Emergency Medicine

## 2013-10-28 DIAGNOSIS — F319 Bipolar disorder, unspecified: Secondary | ICD-10-CM | POA: Diagnosis present

## 2013-10-28 DIAGNOSIS — F17200 Nicotine dependence, unspecified, uncomplicated: Secondary | ICD-10-CM | POA: Diagnosis present

## 2013-10-28 DIAGNOSIS — Z880 Allergy status to penicillin: Secondary | ICD-10-CM | POA: Insufficient documentation

## 2013-10-28 DIAGNOSIS — L02818 Cutaneous abscess of other sites: Secondary | ICD-10-CM | POA: Diagnosis present

## 2013-10-28 DIAGNOSIS — T63301A Toxic effect of unspecified spider venom, accidental (unintentional), initial encounter: Secondary | ICD-10-CM

## 2013-10-28 DIAGNOSIS — G40909 Epilepsy, unspecified, not intractable, without status epilepticus: Secondary | ICD-10-CM | POA: Diagnosis present

## 2013-10-28 DIAGNOSIS — F200 Paranoid schizophrenia: Secondary | ICD-10-CM | POA: Diagnosis present

## 2013-10-28 DIAGNOSIS — W57XXXA Bitten or stung by nonvenomous insect and other nonvenomous arthropods, initial encounter: Principal | ICD-10-CM

## 2013-10-28 DIAGNOSIS — A419 Sepsis, unspecified organism: Principal | ICD-10-CM | POA: Diagnosis present

## 2013-10-28 DIAGNOSIS — Z792 Long term (current) use of antibiotics: Secondary | ICD-10-CM | POA: Insufficient documentation

## 2013-10-28 DIAGNOSIS — Y93E1 Activity, personal bathing and showering: Secondary | ICD-10-CM | POA: Insufficient documentation

## 2013-10-28 DIAGNOSIS — L03818 Cellulitis of other sites: Secondary | ICD-10-CM

## 2013-10-28 DIAGNOSIS — Z79899 Other long term (current) drug therapy: Secondary | ICD-10-CM | POA: Insufficient documentation

## 2013-10-28 DIAGNOSIS — L089 Local infection of the skin and subcutaneous tissue, unspecified: Secondary | ICD-10-CM | POA: Insufficient documentation

## 2013-10-28 DIAGNOSIS — Y929 Unspecified place or not applicable: Secondary | ICD-10-CM | POA: Insufficient documentation

## 2013-10-28 DIAGNOSIS — Z872 Personal history of diseases of the skin and subcutaneous tissue: Secondary | ICD-10-CM | POA: Insufficient documentation

## 2013-10-28 DIAGNOSIS — S60469A Insect bite (nonvenomous) of unspecified finger, initial encounter: Principal | ICD-10-CM

## 2013-10-28 HISTORY — DX: Cellulitis of other sites: L03.818

## 2013-10-28 LAB — CBC WITH DIFFERENTIAL/PLATELET
Basophils Absolute: 0 10*3/uL (ref 0.0–0.1)
Basophils Relative: 0 % (ref 0–1)
Eosinophils Absolute: 0.1 10*3/uL (ref 0.0–0.7)
Eosinophils Relative: 0 % (ref 0–5)
HCT: 50.4 % (ref 39.0–52.0)
HEMOGLOBIN: 18.6 g/dL — AB (ref 13.0–17.0)
LYMPHS ABS: 0.7 10*3/uL (ref 0.7–4.0)
Lymphocytes Relative: 4 % — ABNORMAL LOW (ref 12–46)
MCH: 34.6 pg — ABNORMAL HIGH (ref 26.0–34.0)
MCHC: 36.9 g/dL — ABNORMAL HIGH (ref 30.0–36.0)
MCV: 93.7 fL (ref 78.0–100.0)
MONOS PCT: 2 % — AB (ref 3–12)
Monocytes Absolute: 0.3 10*3/uL (ref 0.1–1.0)
NEUTROS ABS: 15.9 10*3/uL — AB (ref 1.7–7.7)
NEUTROS PCT: 94 % — AB (ref 43–77)
Platelets: 237 10*3/uL (ref 150–400)
RBC: 5.38 MIL/uL (ref 4.22–5.81)
RDW: 12.6 % (ref 11.5–15.5)
WBC: 17 10*3/uL — ABNORMAL HIGH (ref 4.0–10.5)

## 2013-10-28 MED ORDER — SULFAMETHOXAZOLE-TRIMETHOPRIM 800-160 MG PO TABS: 2.0000 | ORAL_TABLET | Freq: Two times a day (BID) | ORAL | Status: DC

## 2013-10-28 NOTE — ED Provider Notes (Signed)
CSN: [id number]     Arrival date & time 10/28/13  1211 History  This chart was scribed for non-physician practitioner working with Gerhard Munch, MD by Ashley Jacobs, ED scribe. This patient was seen in room TR07C/TR07C and the patient's care was started at 1:19 PM.   None    Chief Complaint  Patient presents with  . Wound Infection   (Consider location/radiation/quality/duration/timing/severity/associated sxs/prior Treatment) The history is provided by the patient, medical records and a relative. No language interpreter was used.   HPI Comments: Joel Randolph is a 23 y.o. male who presents to the Emergency Department complaining of wound behind his left ear from what he speculates to be a spider bite that occurred five days ago.  Pt states he saw the spider while in the shower.  Denies fever, chills, nausea, vomiting, and generalized body aches. He denies any swelling of the neck or difficulty swallowing.  He is unsure if the area has grown in size, but thinks the site is gradually getting larger. Yesterday his mother "squeezed" fluid out the area and removed the scab. Nothing seems to resolve his symptoms. Pt has allergies to amoxicillins and penicillins.  Patient denies any history of HIV or DM.      Past Medical History  Diagnosis Date  . Paranoid schizophrenia   . Seizure disorder   . Bipolar disorder   . Depression   . Seizures    Past Surgical History  Procedure Laterality Date  . Orif wrist fracture     Family History  Problem Relation Age of Onset  . Panic disorder Mother   . Depression Mother   . Depression Father   . Schizophrenia Father   . COPD Father   . Bipolar disorder Father    History  Substance Use Topics  . Smoking status: Current Every Day Smoker -- 1.00 packs/day for 10 years    Types: Cigarettes  . Smokeless tobacco: Not on file  . Alcohol Use: No    Review of Systems  Constitutional: Negative for fever.  Gastrointestinal: Negative for  vomiting.  Musculoskeletal: Negative for myalgias.    Allergies  Amoxicillin and Penicillins  Home Medications   Current Outpatient Rx  Name  Route  Sig  Dispense  Refill  . albuterol (PROVENTIL HFA;VENTOLIN HFA) 108 (90 BASE) MCG/ACT inhaler   Inhalation   Inhale 2 puffs into the lungs every 6 (six) hours as needed. As needed for shortness of breath.         . ALPRAZolam (XANAX) 1 MG tablet   Oral   Take 1 mg by mouth at bedtime as needed. Nausea         . cephALEXin (KEFLEX) 500 MG capsule   Oral   Take 1 capsule (500 mg total) by mouth 4 (four) times daily. Take all of medicine and drink lots of fluids   28 capsule   0   . cyclobenzaprine (FLEXERIL) 10 MG tablet   Oral   Take 10 mg by mouth 3 (three) times daily as needed for muscle spasms.         Marland Kitchen doxycycline (VIBRAMYCIN) 100 MG capsule   Oral   Take 1 capsule (100 mg total) by mouth 2 (two) times daily.   20 capsule   0   . mupirocin nasal ointment (BACTROBAN) 2 %      Apply in each nostril 3 times a day after warm soak   10 g   1   . omeprazole (PRILOSEC)  20 MG capsule   Oral   Take 20 mg by mouth daily.         Marland Kitchen oxyCODONE-acetaminophen (PERCOCET/ROXICET) 5-325 MG per tablet   Oral   Take 1-2 tablets by mouth every 6 (six) hours as needed for pain.   20 tablet   0   . phenytoin (DILANTIN) 100 MG ER capsule   Oral   Take 200-300 mg by mouth 3 (three) times daily. 2 capsules by mouth in the morning and 3 capsules in the evening.         . promethazine (PHENERGAN) 25 MG tablet   Oral   Take 25 mg by mouth every 6 (six) hours as needed for nausea.         Marland Kitchen QUEtiapine (SEROQUEL) 200 MG tablet   Oral   Take 200 mg by mouth 3 (three) times daily.          . sertraline (ZOLOFT) 50 MG tablet   Oral   Take 50 mg by mouth at bedtime.         . traMADol (ULTRAM) 50 MG tablet   Oral   Take 1 tablet (50 mg total) by mouth every 6 (six) hours as needed for pain.   15 tablet   0   .  traZODone (DESYREL) 100 MG tablet   Oral   Take 100 mg by mouth at bedtime as needed. As needed for insomnia.          BP 122/80  Pulse 89  Temp(Src) 98.6 F (37 C)  Resp 16  SpO2 98% Physical Exam  Nursing note and vitals reviewed. Constitutional: He is oriented to person, place, and time. He appears well-developed and well-nourished. No distress.  HENT:  Head: Normocephalic and atraumatic.  Right Ear: Tympanic membrane and external ear normal.  Left Ear: Tympanic membrane and external ear normal.  Nose: Nose normal.  Mouth/Throat: Uvula is midline, oropharynx is clear and moist and mucous membranes are normal.  Eyes: EOM are normal. Pupils are equal, round, and reactive to light.  Neck: Normal range of motion. Neck supple.  Cardiovascular: Normal rate, regular rhythm and normal heart sounds.   Pulmonary/Chest: Effort normal and breath sounds normal. No respiratory distress. He has no wheezes. He has no rales.  Musculoskeletal: Normal range of motion.  Neurological: He is alert and oriented to person, place, and time.  Skin: Skin is warm and dry.  2 cm erythematous indurated area just posterior to the left ear with a central scab No fluctuance.  No drainage.      Psychiatric: He has a normal mood and affect. His behavior is normal.    ED Course  Procedures (including critical care time) DIAGNOSTIC STUDIES: Oxygen Saturation is 98% on room air, normal by my interpretation.    COORDINATION OF CARE:  1:23 PM Discussed course of care with pt . Pt understands and agrees.   Labs Review Labs Reviewed - No data to display Imaging Review No results found.  EKG Interpretation   None      Discussed patient with Dr. Jeraldine Loots. MDM  No diagnosis found. Patient presenting with an erythematous area posterior to the left ear, which he feels is secondary to a spider bite.  Area has been present for the past 5 days.  No fluctuance on exam.  Ear canal and TM of left ear appear  normal.  No swelling of the neck or difficulty swallowing.  Patient is afebrile and not exhibiting any systemic symptoms.  No history of DM, HIV, or other illnesses that would make him immunocompromised.  Patient discharged home with Rx for Bactrim DS.  Strict return precautions given.  Patient discussed with Dr. Jeraldine LootsLockwood who agrees with the plan.   Santiago GladHeather Navil Kole, PA-C 10/30/13 1155

## 2013-10-28 NOTE — ED Notes (Signed)
Pt. arrived with EMS from home , reports low grade fever with body aches and SOB onset this evening , seen here today prescribed with antibiotic for his abscess/insect bite at left ear .

## 2013-10-28 NOTE — ED Notes (Signed)
Patient states he was bitten by a "brown recluse" on Friday behind L ear.  Patient states he brushed the spider out of his hair and knows what kind it was.

## 2013-10-28 NOTE — ED Notes (Addendum)
Bump behind left ear since last week  Hurts   His head

## 2013-10-29 ENCOUNTER — Emergency Department (HOSPITAL_COMMUNITY): Payer: Self-pay

## 2013-10-29 ENCOUNTER — Inpatient Hospital Stay (HOSPITAL_COMMUNITY)
Admission: EM | Admit: 2013-10-29 | Discharge: 2013-10-30 | DRG: 872 | Disposition: A | Discharge status: Home / Self Care | Payer: Medicaid Other | Attending: Internal Medicine | Admitting: Internal Medicine

## 2013-10-29 ENCOUNTER — Encounter (HOSPITAL_COMMUNITY): Payer: Self-pay | Admitting: Radiology

## 2013-10-29 DIAGNOSIS — L03818 Cellulitis of other sites: Secondary | ICD-10-CM | POA: Diagnosis present

## 2013-10-29 DIAGNOSIS — L02811 Cutaneous abscess of head [any part, except face]: Secondary | ICD-10-CM

## 2013-10-29 DIAGNOSIS — L0291 Cutaneous abscess, unspecified: Secondary | ICD-10-CM

## 2013-10-29 DIAGNOSIS — L0390 Cellulitis, unspecified: Secondary | ICD-10-CM | POA: Diagnosis present

## 2013-10-29 DIAGNOSIS — A419 Sepsis, unspecified organism: Secondary | ICD-10-CM

## 2013-10-29 HISTORY — DX: Cellulitis of other sites: L03.818

## 2013-10-29 LAB — COMPREHENSIVE METABOLIC PANEL
ALBUMIN: 4.4 g/dL (ref 3.5–5.2)
ALK PHOS: 89 U/L (ref 39–117)
ALT: 16 U/L (ref 0–53)
AST: 14 U/L (ref 0–37)
BILIRUBIN TOTAL: 0.2 mg/dL — AB (ref 0.3–1.2)
BUN: 7 mg/dL (ref 6–23)
CHLORIDE: 98 meq/L (ref 96–112)
CO2: 27 mEq/L (ref 19–32)
Calcium: 9.2 mg/dL (ref 8.4–10.5)
Creatinine, Ser: 0.79 mg/dL (ref 0.50–1.35)
GFR calc non Af Amer: >90 mL/min (ref >90)
GLUCOSE: 101 mg/dL — AB (ref 70–99)
POTASSIUM: 4.1 meq/L (ref 3.7–5.3)
Sodium: 140 mEq/L (ref 137–147)
Total Protein: 8.2 g/dL (ref 6.0–8.3)

## 2013-10-29 LAB — LACTIC ACID, PLASMA: LACTIC ACID, VENOUS: 1.8 mmol/L (ref 0.5–2.2)

## 2013-10-29 MED ORDER — LIDOCAINE HCL (PF) 1 % IJ SOLN
30.0000 mL | Freq: Once | INTRAMUSCULAR | Status: AC
  Administered 2013-10-29: 30 mL via INTRADERMAL
  Filled 2013-10-29: qty 30

## 2013-10-29 MED ORDER — PHENYTOIN SODIUM EXTENDED 100 MG PO CAPS
300.0000 mg | ORAL_CAPSULE | Freq: Every day | ORAL | Status: DC
  Administered 2013-10-29: 300 mg via ORAL
  Filled 2013-10-29 (×2): qty 3

## 2013-10-29 MED ORDER — SODIUM CHLORIDE 0.9 % IV SOLN
Freq: Once | INTRAVENOUS | Status: AC
  Administered 2013-10-29: 02:00:00 via INTRAVENOUS

## 2013-10-29 MED ORDER — PHENYTOIN SODIUM EXTENDED 100 MG PO CAPS
200.0000 mg | ORAL_CAPSULE | Freq: Once | ORAL | Status: AC
  Administered 2013-10-29: 200 mg via ORAL
  Filled 2013-10-29: qty 2

## 2013-10-29 MED ORDER — IBUPROFEN 400 MG PO TABS
400.0000 mg | ORAL_TABLET | ORAL | Status: DC | PRN
  Administered 2013-10-29 – 2013-10-30 (×2): 400 mg via ORAL
  Filled 2013-10-29 (×3): qty 1

## 2013-10-29 MED ORDER — SODIUM CHLORIDE 0.9 % IV BOLUS (SEPSIS)
1000.0000 mL | Freq: Once | INTRAVENOUS | Status: AC
  Administered 2013-10-29: 1000 mL via INTRAVENOUS

## 2013-10-29 MED ORDER — IBUPROFEN 400 MG PO TABS
400.0000 mg | ORAL_TABLET | Freq: Once | ORAL | Status: AC
  Administered 2013-10-29: 400 mg via ORAL
  Filled 2013-10-29: qty 1

## 2013-10-29 MED ORDER — IBUPROFEN 800 MG PO TABS
800.0000 mg | ORAL_TABLET | Freq: Once | ORAL | Status: AC
  Administered 2013-10-29: 800 mg via ORAL
  Filled 2013-10-29: qty 1

## 2013-10-29 MED ORDER — HYDROMORPHONE HCL PF 1 MG/ML IJ SOLN
1.0000 mg | Freq: Once | INTRAMUSCULAR | Status: AC
  Administered 2013-10-29: 1 mg via INTRAVENOUS
  Filled 2013-10-29: qty 1

## 2013-10-29 MED ORDER — ONDANSETRON HCL 4 MG/2ML IJ SOLN
4.0000 mg | Freq: Once | INTRAMUSCULAR | Status: AC
  Administered 2013-10-29: 4 mg via INTRAVENOUS
  Filled 2013-10-29: qty 2

## 2013-10-29 MED ORDER — FENTANYL CITRATE 0.05 MG/ML IJ SOLN
50.0000 ug | Freq: Once | INTRAMUSCULAR | Status: AC
  Administered 2013-10-29: 100 ug via INTRAVENOUS
  Filled 2013-10-29: qty 2

## 2013-10-29 MED ORDER — QUETIAPINE FUMARATE ER 400 MG PO TB24
800.0000 mg | ORAL_TABLET | Freq: Every day | ORAL | Status: DC
  Administered 2013-10-29: 800 mg via ORAL
  Filled 2013-10-29 (×3): qty 2

## 2013-10-29 MED ORDER — PHENYTOIN SODIUM EXTENDED 100 MG PO CAPS
200.0000 mg | ORAL_CAPSULE | Freq: Every day | ORAL | Status: DC
  Administered 2013-10-30: 200 mg via ORAL
  Filled 2013-10-29 (×3): qty 2

## 2013-10-29 MED ORDER — HEPARIN SODIUM (PORCINE) 5000 UNIT/ML IJ SOLN
5000.0000 [IU] | Freq: Three times a day (TID) | INTRAMUSCULAR | Status: DC
  Administered 2013-10-29 – 2013-10-30 (×4): 5000 [IU] via SUBCUTANEOUS
  Filled 2013-10-29 (×7): qty 1

## 2013-10-29 MED ORDER — HYDROCODONE-ACETAMINOPHEN 5-325 MG PO TABS
1.0000 | ORAL_TABLET | ORAL | Status: DC | PRN
  Administered 2013-10-29 (×2): 2 via ORAL
  Filled 2013-10-29 (×2): qty 2

## 2013-10-29 MED ORDER — VANCOMYCIN HCL IN DEXTROSE 1-5 GM/200ML-% IV SOLN
1000.0000 mg | Freq: Three times a day (TID) | INTRAVENOUS | Status: DC
  Administered 2013-10-29 – 2013-10-30 (×3): 1000 mg via INTRAVENOUS
  Filled 2013-10-29 (×5): qty 200

## 2013-10-29 MED ORDER — VANCOMYCIN HCL IN DEXTROSE 1-5 GM/200ML-% IV SOLN
1000.0000 mg | Freq: Once | INTRAVENOUS | Status: AC
  Administered 2013-10-29: 1000 mg via INTRAVENOUS
  Filled 2013-10-29: qty 200

## 2013-10-29 NOTE — ED Notes (Signed)
Dr. Julian ReilGardner returned page, orders to continue all medications from home except for Sulfa.

## 2013-10-29 NOTE — ED Notes (Signed)
Pt with breakfast tray at the bedside.

## 2013-10-29 NOTE — H&P (Signed)
[facility] History and Physical  Joel Randolph JXB:[id number] DOB: 08/17/1991 DOA: 10/29/2013  Referring physician: EDP PCP: Ailene RavelHAMRICK,MAURA L, MD   Chief Complaint: Cellulitis   HPI: Joel Randolph is a 23 y.o. male who presents with pain, erythema, swelling, just posterior to his ear on the left side.  Symptoms onset Friday initially with pain localized to that area, attributed it to a "spider bite".  Seen here yesterday and started on bactrim.  Today developed generalized body aches, fevers, chills, dizziness, inability to ambulate, and other systemic symptoms of infection.  Review of Systems: Systems reviewed.  As above, otherwise negative  Past Medical History  Diagnosis Date  . Paranoid schizophrenia   . Seizure disorder   . Bipolar disorder   . Depression   . Seizures    Past Surgical History  Procedure Laterality Date  . Orif wrist fracture     Social History:  reports that he has been smoking Cigarettes.  He has a 10 pack-year smoking history. He does not have any smokeless tobacco history on file. He reports that he does not drink alcohol or use illicit drugs.  Allergies  Allergen Reactions  . Amoxicillin Anaphylaxis and Hives  . Peanut Oil Anaphylaxis    NUTS  . Penicillins Anaphylaxis and Hives    hives    Family History  Problem Relation Age of Onset  . Panic disorder Mother   . Depression Mother   . Depression Father   . Schizophrenia Father   . COPD Father   . Bipolar disorder Father      Prior to Admission medications   Medication Sig Start Date End Date Taking? Authorizing Provider  albuterol (PROVENTIL HFA;VENTOLIN HFA) 108 (90 BASE) MCG/ACT inhaler Inhale 2 puffs into the lungs every 6 (six) hours as needed. As needed for shortness of breath.   Yes Historical Provider, MD  cholecalciferol (VITAMIN D) 1000 UNITS tablet Take 1,000 Units by mouth daily.   Yes Historical Provider, MD  nabumetone (RELAFEN) 750 MG tablet Take 750 mg by mouth 2  (two) times daily.   Yes Historical Provider, MD  omeprazole (PRILOSEC) 40 MG capsule Take 40 mg by mouth daily.   Yes Historical Provider, MD  phenytoin (DILANTIN) 100 MG ER capsule Take 200-300 mg by mouth 3 (three) times daily. 2 capsules by mouth in the morning and 3 capsules in the evening.   Yes Historical Provider, MD  QUEtiapine (SEROQUEL XR) 400 MG 24 hr tablet Take 800 mg by mouth daily after supper.   Yes Historical Provider, MD  sulfamethoxazole-trimethoprim (SEPTRA DS) 800-160 MG per tablet Take 2 tablets by mouth 2 (two) times daily. 10/28/13  Yes Santiago GladHeather Laisure, PA-C   Physical Exam: Filed Vitals:   10/29/13 0503  BP: 113/69  Pulse: 114  Temp: 99.3 F (37.4 C)  Resp: 16    BP 113/69  Pulse 114  Temp(Src) 99.3 F (37.4 C) (Oral)  Resp 16  SpO2 98%  General Appearance:    Alert, oriented, no distress, appears stated age  Head:    Normocephalic, atraumatic  Eyes:    PERRL, EOMI, sclera non-icteric        Nose:   Nares without drainage or epistaxis. Mucosa, turbinates normal  Throat:   Moist mucous membranes. Oropharynx without erythema or exudate.  Neck:   Supple. No carotid bruits.  No thyromegaly.  No lymphadenopathy.   Back:     No CVA tenderness, no spinal tenderness  Lungs:     Clear  to auscultation bilaterally, without wheezes, rhonchi or rales  Chest wall:    No tenderness to palpitation  Heart:    Regular rate and rhythm without murmurs, gallops, rubs  Abdomen:     Soft, non-tender, nondistended, normal bowel sounds, no organomegaly  Genitalia:    deferred  Rectal:    deferred  Extremities:   No clubbing, cyanosis or edema.  Pulses:   2+ and symmetric all extremities  Skin:   Skin color, texture, turgor normal, no rashes or lesions  Lymph nodes:   Cervical, supraclavicular, and axillary nodes normal  Neurologic:   CNII-XII intact. Normal strength, sensation and reflexes      throughout    Labs on Admission:  Basic Metabolic Panel:  Recent Labs Lab  10/28/13 2350  NA 140  K 4.1  CL 98  CO2 27  GLUCOSE 101*  BUN 7  CREATININE 0.79  CALCIUM 9.2   Liver Function Tests:  Recent Labs Lab 10/28/13 2350  AST 14  ALT 16  ALKPHOS 89  BILITOT 0.2*  PROT 8.2  ALBUMIN 4.4   No results found for this basename: LIPASE, AMYLASE,  in the last 168 hours No results found for this basename: AMMONIA,  in the last 168 hours CBC:  Recent Labs Lab 10/28/13 2350  WBC 17.0*  NEUTROABS 15.9*  HGB 18.6*  HCT 50.4  MCV 93.7  PLT 237   Cardiac Enzymes: No results found for this basename: CKTOTAL, CKMB, CKMBINDEX, TROPONINI,  in the last 168 hours  BNP (last 3 results) No results found for this basename: PROBNP,  in the last 8760 hours CBG: No results found for this basename: GLUCAP,  in the last 168 hours  Radiological Exams on Admission: Ct Head Wo Contrast  10/29/2013   CLINICAL DATA:  Bit behind the left ear by a spider. Fever and nausea.  EXAM: CT HEAD WITHOUT CONTRAST  TECHNIQUE: Contiguous axial images were obtained from the base of the skull through the vertex without intravenous contrast.  COMPARISON:  MR HEAD W/O CM dated 04/20/2011; CT HEAD W/O CM dated 03/01/2011  FINDINGS: There is no evidence of acute infarction, mass lesion, or intra- or extra-axial hemorrhage on CT.  The posterior fossa, including the cerebellum, brainstem and fourth ventricle, is within normal limits. The third and lateral ventricles, and basal ganglia are unremarkable in appearance. The cerebral hemispheres are symmetric in appearance, with normal gray-white differentiation. No mass effect or midline shift is seen.  There is no evidence of fracture; visualized osseous structures are unremarkable in appearance. The orbits are within normal limits. Mild mucosal thickening is noted in the maxillary sinuses, and there is partial opacification of the sphenoid sinus. The remaining paranasal sinuses and mastoid air cells are well-aerated. Soft tissue swelling is noted  about the left ear, and posterior to the left ear.  IMPRESSION: 1. No acute intracranial pathology seen on CT. 2. Soft tissue swelling about the left ear, and posterior to the left ear. 3. Mild mucosal thickening at the maxillary sinuses, and partial opacification of the sphenoid sinus.   Electronically Signed   By: Roanna Raider M.D.   On: 10/29/2013 05:07    EKG: Independently reviewed.  Assessment/Plan Principal Problem:   Cellulitis of mastoid Active Problems:   Cellulitis   1. Cellulitis in the posterior auricular area with resulting sepsis - putting patient on vancomycin given his systemic symptoms and findings of leukocytosis and fever in the ED.  Admitting to inpatient for treatment.  CT  head showed soft tissue swelling but significantly did NOT show findings of mastoiditis.    Code Status: Full  Family Communication: Mother at bedside Disposition Plan: Admit to inpatient   Time spent: 70 min  GARDNER, JARED M. [facility] Pager [phone]  If 7AM-7PM, please contact the day team taking care of the patient [email or web address] Password [email or web address] 10/29/2013, 5:10 AM

## 2013-10-29 NOTE — ED Notes (Signed)
MD at BS

## 2013-10-29 NOTE — ED Notes (Signed)
MD at bedside. (ED physician for I&D).

## 2013-10-29 NOTE — ED Notes (Signed)
Phlebotomy informed pt needs blood cultures drawn.

## 2013-10-29 NOTE — Progress Notes (Signed)
ANTIBIOTIC CONSULT NOTE - INITIAL  Pharmacy Consult for vancomycin Indication: cellulitis resulting in possible sepsis  Allergies  Allergen Reactions  . Amoxicillin Anaphylaxis and Hives  . Peanut Oil Anaphylaxis    NUTS  . Penicillins Anaphylaxis and Hives    hives    Patient Measurements: Height: 5\' 5"  (165.1 cm) Weight: 156 lb (70.761 kg) IBW/kg (Calculated) : 61.5  Vital Signs: Temp: 99.3 F (37.4 C) (01/21 0503) Temp src: Oral (01/21 0503) BP: 113/69 mmHg (01/21 0503) Pulse Rate: 114 (01/21 0503)  Labs:  Recent Labs  10/28/13 2350  WBC 17.0*  HGB 18.6*  PLT 237  CREATININE 0.79   Estimated Creatinine Clearance: 127.1 ml/min (by C-G formula based on Cr of 0.79).   Microbiology: No results found for this or any previous visit (from the past 720 hour(s)).  Medical History: Past Medical History  Diagnosis Date  . Paranoid schizophrenia   . Seizure disorder   . Bipolar disorder   . Depression   . Seizures      Assessment: 23yo male states he was bitten by a spider behind his left ear on Friday, was Rx'd ABX and had taken one dose, now c/o low-grade fever w/ body aches and SOB, found w/ leukocytosis, to be admitted and start IV ABX for cellulitis and possible resulting sepsis given systemic Sx.  Goal of Therapy:  Vancomycin trough level 15-20 mcg/ml (will decrease if sepsis ruled out)  Plan:  Rec'd vanc 1g in ED; will continue with vancomycin vancomycin 1000mg  IV Q8H and monitor CBC, Cx, levels prn.  Vernard GamblesVeronda Shatisha Falter, PharmD, BCPS  10/29/2013,5:17 AM

## 2013-10-29 NOTE — Progress Notes (Signed)
H and P from 7 hours ago reviewed. Agree with assessment and plan as outlined above. Pt with facial cellulitis. Improving with abx. Afebrile. Feeling well. Would cont IV abx for another day and consider d/c on PO abx if remains stable.

## 2013-10-29 NOTE — ED Notes (Signed)
Pt restless, writhing in pain, pinpoints pain to behind L ear, but also reports generalized, body aches[facility].

## 2013-10-29 NOTE — ED Provider Notes (Signed)
CSN: [id number]     Arrival date & time 10/28/13  2335 History   First MD Initiated Contact with Patient 10/29/13 0304     Chief Complaint  Patient presents with  . Abscess   (Consider location/radiation/quality/duration/timing/severity/associated sxs/prior Treatment) HPI Comments: 23 yo male with schizophrenia, seizures presents with left skin infection behind ear worsening since Wednesday. No known bites or injuries.  No known MRSA.  Pain with palpation, gradually worsening.  Started on PO abx today but not helping.  No travel or immunosuppression hx.   Patient is a [age] y.o. male presenting with abscess. The history is provided by the patient.  Abscess Associated symptoms: fever and headaches   Associated symptoms: no vomiting     Past Medical History  Diagnosis Date  . Paranoid schizophrenia   . Seizure disorder   . Bipolar disorder   . Depression   . Seizures    Past Surgical History  Procedure Laterality Date  . Orif wrist fracture     Family History  Problem Relation Age of Onset  . Panic disorder Mother   . Depression Mother   . Depression Father   . Schizophrenia Father   . COPD Father   . Bipolar disorder Father    History  Substance Use Topics  . Smoking status: Current Every Day Smoker -- 1.00 packs/day for 10 years    Types: Cigarettes  . Smokeless tobacco: Not on file  . Alcohol Use: No    Review of Systems  Constitutional: Positive for fever, chills and appetite change.  HENT: Negative for congestion.   Eyes: Negative for visual disturbance.  Respiratory: Negative for shortness of breath.   Cardiovascular: Negative for chest pain.  Gastrointestinal: Negative for vomiting and abdominal pain.  Genitourinary: Negative for dysuria and flank pain.  Musculoskeletal: Positive for neck pain. Negative for back pain and neck stiffness.  Skin: Positive for rash and wound.  Neurological: Positive for headaches. Negative for light-headedness.    Allergies   Amoxicillin; Peanut oil; and Penicillins  Home Medications   Current Outpatient Rx  Name  Route  Sig  Dispense  Refill  . albuterol (PROVENTIL HFA;VENTOLIN HFA) 108 (90 BASE) MCG/ACT inhaler   Inhalation   Inhale 2 puffs into the lungs every 6 (six) hours as needed. As needed for shortness of breath.         . cholecalciferol (VITAMIN D) 1000 UNITS tablet   Oral   Take 1,000 Units by mouth daily.         . nabumetone (RELAFEN) 750 MG tablet   Oral   Take 750 mg by mouth 2 (two) times daily.         Marland Kitchen. omeprazole (PRILOSEC) 40 MG capsule   Oral   Take 40 mg by mouth daily.         . phenytoin (DILANTIN) 100 MG ER capsule   Oral   Take 200-300 mg by mouth 3 (three) times daily. 2 capsules by mouth in the morning and 3 capsules in the evening.         Marland Kitchen. QUEtiapine (SEROQUEL XR) 400 MG 24 hr tablet   Oral   Take 800 mg by mouth daily after supper.         . sulfamethoxazole-trimethoprim (SEPTRA DS) 800-160 MG per tablet   Oral   Take 2 tablets by mouth 2 (two) times daily.   40 tablet   0    BP 117/97  Pulse 117  Temp(Src) 100.7 F (38.2  C) (Oral)  Resp 20  SpO2 97% Physical Exam  Nursing note and vitals reviewed. Constitutional: He is oriented to person, place, and time. He appears well-developed and well-nourished.  HENT:  Head: Normocephalic.  Focal abscess, mild induration, scab centrally, surrounding erythema posterior to left ear, neck supple, mild tender left neck  Eyes: Conjunctivae are normal. Right eye exhibits no discharge. Left eye exhibits no discharge.  Neck: Normal range of motion. Neck supple. No tracheal deviation present.  Cardiovascular: Normal rate and regular rhythm.   Pulmonary/Chest: Effort normal and breath sounds normal.  Abdominal: Soft. He exhibits no distension. There is no tenderness. There is no guarding.  Musculoskeletal: He exhibits no edema.  Neurological: He is alert and oriented to person, place, and time.  Skin: Skin  is warm. Rash noted. There is erythema.  Psychiatric: He has a normal mood and affect.    ED Course  Procedures (including critical care time) EMERGENCY DEPARTMENT US SOFT TISSUE INTERPRETATION "Study: Limited Soft Tissue Ultrasound"  INDICATIONS: Pain and Soft tissue infection Multiple views of the body part were obtained in real-time with a multi-frequency linear probe PERFORMED BY:  Myself IMAGES ARCHIVED?: Yes SIDE:Left BODY PART:Other soft tisse (comment in note) FINDINGS: Abcess present and Cellulitis present INTERPRETATION:  Abcess present and Cellulitis present    Labs Review Labs Reviewed  CBC WITH DIFFERENTIAL - Abnormal; Notable for the following:    WBC 17.0 (*)    Hemoglobin 18.6 (*)    MCH 34.6 (*)    MCHC 36.9 (*)    Neutrophils Relative % 94 (*)    Neutro Abs 15.9 (*)    Lymphocytes Relative 4 (*)    Monocytes Relative 2 (*)    All other components within normal limits  COMPREHENSIVE METABOLIC PANEL - Abnormal; Notable for the following:    Glucose, Bld 101 (*)    Total Bilirubin 0.2 (*)    All other components within normal limits  CULTURE, BLOOD (ROUTINE X 2)  CULTURE, BLOOD (ROUTINE X 2)  LACTIC ACID, PLASMA   Imaging Review Ct Head Wo Contrast  10/29/2013   CLINICAL DATA:  Bit behind the left ear by a spider. Fever and nausea.  EXAM: CT HEAD WITHOUT CONTRAST  TECHNIQUE: Contiguous axial images were obtained from the base of the skull through the vertex without intravenous contrast.  COMPARISON:  MR HEAD W/O CM dated 04/20/2011; CT HEAD W/O CM dated 03/01/2011  FINDINGS: There is no evidence of acute infarction, mass lesion, or intra- or extra-axial hemorrhage on CT.  The posterior fossa, including the cerebellum, brainstem and fourth ventricle, is within normal limits. The third and lateral ventricles, and basal ganglia are unremarkable in appearance. The cerebral hemispheres are symmetric in appearance, with normal gray-white differentiation. No mass  effect or midline shift is seen.  There is no evidence of fracture; visualized osseous structures are unremarkable in appearance. The orbits are within normal limits. Mild mucosal thickening is noted in the maxillary sinuses, and there is partial opacification of the sphenoid sinus. The remaining paranasal sinuses and mastoid air cells are well-aerated. Soft tissue swelling is noted about the left ear, and posterior to the left ear.  IMPRESSION: 1. No acute intracranial pathology seen on CT. 2. Soft tissue swelling about the left ear, and posterior to the left ear. 3. Mild mucosal thickening at the maxillary sinuses, and partial opacification of the sphenoid sinus.   Electronically Signed   By: Roanna Raider M.D.   On: 10/29/2013 05:07  EKG Interpretation   None       MDM   1. Cellulitis of mastoid   2. Scalp abscess   3. Sepsis   4. Cellulitis and abscess    Concern for scalp abscess. CT to look for extent and signs of mastoiditis. Vanco and cultures. FLuids, pain meds. Spoke with [facility].Dr Julian Reil, accepted Bedside US showed very small .5 cm fluid collection.  The patients results and plan were reviewed and discussed.   Any x-rays performed were personally reviewed by myself.  Fluid boluses in ED, vitals improved.  Abx IV given vanco.  Differential diagnosis were considered with the presenting HPI.  Admission/ observation were discussed with the admitting physician, patient and/or family and they are comfortable with the plan.      Enid Skeens, MD 10/29/13 [phone]

## 2013-10-29 NOTE — ED Notes (Signed)
Dr. Julian ReilGardner paged in regards to patient's home medication.

## 2013-10-29 NOTE — ED Notes (Signed)
Patient transported to CT 

## 2013-10-29 NOTE — ED Notes (Signed)
Phlebotomy at BS

## 2013-10-29 NOTE — Progress Notes (Signed)
UR Completed Camaria Gerald Graves-Bigelow, RN,BSN [phone]  

## 2013-10-30 DIAGNOSIS — A419 Sepsis, unspecified organism: Principal | ICD-10-CM

## 2013-10-30 DIAGNOSIS — L0291 Cutaneous abscess, unspecified: Secondary | ICD-10-CM

## 2013-10-30 DIAGNOSIS — L02818 Cutaneous abscess of other sites: Secondary | ICD-10-CM

## 2013-10-30 DIAGNOSIS — L0390 Cellulitis, unspecified: Secondary | ICD-10-CM

## 2013-10-30 DIAGNOSIS — L03818 Cellulitis of other sites: Secondary | ICD-10-CM

## 2013-10-30 MED ORDER — DOXYCYCLINE HYCLATE 100 MG PO TABS: 100.0000 mg | ORAL_TABLET | Freq: Two times a day (BID) | ORAL | Status: DC

## 2013-10-30 NOTE — Discharge Summary (Signed)
Physician Discharge Summary  Joel FastJessie A Brandt ZOX:[id number] DOB: 07/15/1991 DOA: 10/29/2013  PCP: Ailene RavelHAMRICK,MAURA L, MD  Admit date: 10/29/2013 Discharge date: 10/30/2013  Time spent: 35 minutes  Recommendations for Outpatient Follow-up:  1. Follow up with PCP in 1-2 weeks as scheduled  Discharge Diagnoses:  Principal Problem:   Cellulitis of mastoid Active Problems:   Cellulitis   Discharge Condition: Improved  Diet recommendation: Regular  Filed Weights   10/29/13 0514  Weight: 70.761 kg (156 lb)    History of present illness:  Joel Randolph is a 23 y.o. male who presents with pain, erythema, swelling, just posterior to his ear on the left side. Symptoms onset Friday initially with pain localized to that area, attributed it to a "spider bite". Seen here yesterday and started on bactrim. Today developed generalized body aches, fevers, chills, dizziness, inability to ambulate, and other systemic symptoms of infection.  Hospital Course:  Cellulitis in the posterior auricular area with resulting sepsis  - Failed bactrim as outpatient - Cont on vancomycin while inpatient - Fevers and leukocytosis resolved - CT head showed soft tissue swelling but significantly did NOT show findings of mastoiditis. - Plan to d/c with total 8 days of doxycycline (10 days tx of antibiotics total)  Discharge Exam: Filed Vitals:   10/29/13 0930 10/29/13 1046 10/29/13 1431 10/30/13 0615  BP: 114/56 109/59 120/66 118/69  Pulse: 104 95 98 92  Temp:  98.2 F (36.8 C) 98.5 F (36.9 C) 98.4 F (36.9 C)  TempSrc:  Oral  Oral  Resp: 17 15 15 18   Height:      Weight:      SpO2: 96% 97% 99% 97%    General: Awake, in nad Cardiovascular: regular, s1, s2 Respiratory: normal resp effort, no wheezing  Discharge Instructions       Future Appointments Provider Department Dept Phone   11/20/2013 2:30 PM [facility] Covering Provider [facility] [phone]        Medication List    STOP taking these medications       sulfamethoxazole-trimethoprim 800-160 MG per tablet  Commonly known as:  SEPTRA DS      TAKE these medications       albuterol 108 (90 BASE) MCG/ACT inhaler  Commonly known as:  PROVENTIL HFA;VENTOLIN HFA  Inhale 2 puffs into the lungs every 6 (six) hours as needed. As needed for shortness of breath.     cholecalciferol 1000 UNITS tablet  Commonly known as:  VITAMIN D  Take 1,000 Units by mouth daily.     doxycycline 100 MG tablet  Commonly known as:  VIBRA-TABS  Take 1 tablet (100 mg total) by mouth 2 (two) times daily.     nabumetone 750 MG tablet  Commonly known as:  RELAFEN  Take 750 mg by mouth 2 (two) times daily.     omeprazole 40 MG capsule  Commonly known as:  PRILOSEC  Take 40 mg by mouth daily.     phenytoin 100 MG ER capsule  Commonly known as:  DILANTIN  Take 200-300 mg by mouth 3 (three) times daily. 2 capsules by mouth in the morning and 3 capsules in the evening.     QUEtiapine 400 MG 24 hr tablet  Commonly known as:  SEROQUEL XR  Take 800 mg by mouth daily after supper.       Allergies  Allergen Reactions  . Amoxicillin Anaphylaxis and Hives  . Peanut Oil Anaphylaxis    NUTS  .  Penicillins Anaphylaxis and Hives    hives  . Sulfamethoxazole-Tmp Ds     ITCHY ING  SHORTNESS OF BREATH       Follow-up Information   Schedule an appointment as soon as possible for a visit with Follow up with PCP in one week.      Follow up with [facility] On 11/20/2013. (@ 2:30 pm. Please have d/c paperwork .)    Contact information:   [address] [place] [address] [phone]       The results of significant diagnostics from this hospitalization (including imaging, microbiology, ancillary and laboratory) are listed below for reference.    Significant Diagnostic Studies: Ct Head Wo Contrast  10/29/2013   CLINICAL DATA:  Bit behind the left ear by a spider.  Fever and nausea.  EXAM: CT HEAD WITHOUT CONTRAST  TECHNIQUE: Contiguous axial images were obtained from the base of the skull through the vertex without intravenous contrast.  COMPARISON:  MR HEAD W/O CM dated 04/20/2011; CT HEAD W/O CM dated 03/01/2011  FINDINGS: There is no evidence of acute infarction, mass lesion, or intra- or extra-axial hemorrhage on CT.  The posterior fossa, including the cerebellum, brainstem and fourth ventricle, is within normal limits. The third and lateral ventricles, and basal ganglia are unremarkable in appearance. The cerebral hemispheres are symmetric in appearance, with normal gray-white differentiation. No mass effect or midline shift is seen.  There is no evidence of fracture; visualized osseous structures are unremarkable in appearance. The orbits are within normal limits. Mild mucosal thickening is noted in the maxillary sinuses, and there is partial opacification of the sphenoid sinus. The remaining paranasal sinuses and mastoid air cells are well-aerated. Soft tissue swelling is noted about the left ear, and posterior to the left ear.  IMPRESSION: 1. No acute intracranial pathology seen on CT. 2. Soft tissue swelling about the left ear, and posterior to the left ear. 3. Mild mucosal thickening at the maxillary sinuses, and partial opacification of the sphenoid sinus.   Electronically Signed   By: Roanna Raider M.D.   On: 10/29/2013 05:07    Microbiology: Recent Results (from the past 240 hour(s))  CULTURE, BLOOD (ROUTINE X 2)     Status: None   Collection Time    10/29/13  4:10 AM      Result Value Range Status   Specimen Description BLOOD RIGHT ARM   Final   Special Requests BOTTLES DRAWN AEROBIC AND ANAEROBIC 10CC EACH   Final   Culture  Setup Time     Final   Value: 10/29/2013 08:48     Performed at [facility]   Culture     Final   Value:        BLOOD CULTURE RECEIVED NO GROWTH TO DATE CULTURE WILL BE HELD FOR 5 DAYS BEFORE ISSUING A FINAL NEGATIVE  REPORT     Performed at [facility]   Report Status PENDING   Incomplete  CULTURE, BLOOD (ROUTINE X 2)     Status: None   Collection Time    10/29/13  4:20 AM      Result Value Range Status   Specimen Description BLOOD RIGHT HAND   Final   Special Requests BOTTLES DRAWN AEROBIC ONLY 10CC   Final   Culture  Setup Time     Final   Value: 10/29/2013 08:48     Performed at [facility]   Culture     Final  Value:        BLOOD CULTURE RECEIVED NO GROWTH TO DATE CULTURE WILL BE HELD FOR 5 DAYS BEFORE ISSUING A FINAL NEGATIVE REPORT     Performed at [facility]   Report Status PENDING   Incomplete     Labs: Basic Metabolic Panel:  Recent Labs Lab 10/28/13 2350  NA 140  K 4.1  CL 98  CO2 27  GLUCOSE 101*  BUN 7  CREATININE 0.79  CALCIUM 9.2   Liver Function Tests:  Recent Labs Lab 10/28/13 2350  AST 14  ALT 16  ALKPHOS 89  BILITOT 0.2*  PROT 8.2  ALBUMIN 4.4   No results found for this basename: LIPASE, AMYLASE,  in the last 168 hours No results found for this basename: AMMONIA,  in the last 168 hours CBC:  Recent Labs Lab 10/28/13 2350  WBC 17.0*  NEUTROABS 15.9*  HGB 18.6*  HCT 50.4  MCV 93.7  PLT 237   Cardiac Enzymes: No results found for this basename: CKTOTAL, CKMB, CKMBINDEX, TROPONINI,  in the last 168 hours BNP: BNP (last 3 results) No results found for this basename: PROBNP,  in the last 8760 hours CBG: No results found for this basename: GLUCAP,  in the last 168 hours  Signed:  Sashia Campas K  [facility] 10/30/2013, 11:34 AM

## 2013-10-30 NOTE — ED Provider Notes (Signed)
  Medical screening examination/treatment/procedure(s) were performed by non-physician practitioner and as supervising physician I was immediately available for consultation/collaboration.      Gerhard Munchobert Juanitta Earnhardt, MD 10/30/13 1328

## 2013-10-30 NOTE — Care Management Note (Signed)
1059 10-30-13 CM [facility]. CM did call [facility]  And co pay for Doxycycline will be 43.04. CM will make staff RN aware along with MD. No further needs from CM at this time. Gala LewandowskyGraves-Bigelow, Vaden Becherer Kaye, RN,BSN [phone]

## 2013-10-30 NOTE — Progress Notes (Signed)
Pt provided with dc instructions and education. Pt verbalized understanding. Aware of follow up appointment. No questions at this time. Levonne Spillerhasidy Derrick Orris, RN

## 2013-11-04 LAB — CULTURE, BLOOD (ROUTINE X 2)
CULTURE: NO GROWTH
Culture: NO GROWTH

## 2013-11-20 ENCOUNTER — Inpatient Hospital Stay: Payer: Medicaid Other | Admitting: Internal Medicine

## 2014-07-21 IMAGING — CT CT HEAD W/O CM
1 series · 15 of 30 positions shown, 19 images · non-contrast
Comparison: MR HEAD W/O CM dated 04/20/2011; CT HEAD W/O CM dated
03/01/2011

CLINICAL DATA: Bit behind the left ear by a spider. Fever and
nausea.

EXAM:
CT HEAD WITHOUT CONTRAST
TECHNIQUE: Contiguous axial images were obtained from the base of the skull
through the vertex without intravenous contrast.

[Series 2: head 5.0 h30s · axial · 0.45mm/px · z∈[-92,+58]mm · 15 of 34 slices shown, 19 images]
[im 2/34  brain]
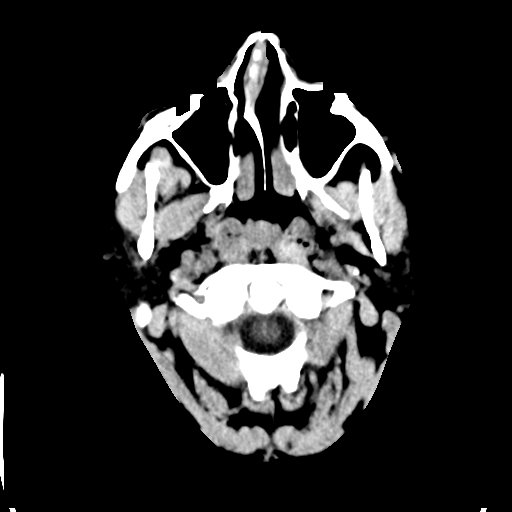
[im 2/34  bone]
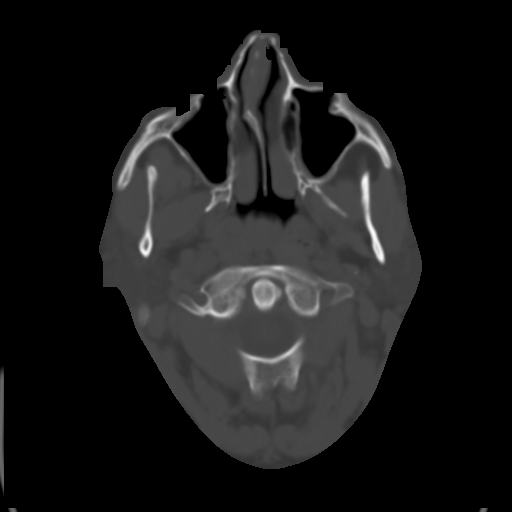
[im 4/34  brain]
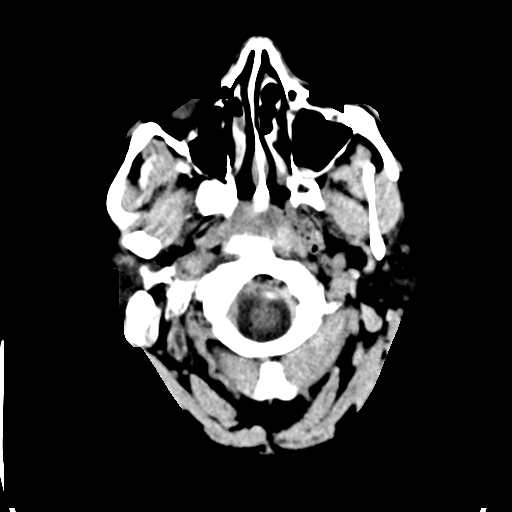
[im 6/34  brain]
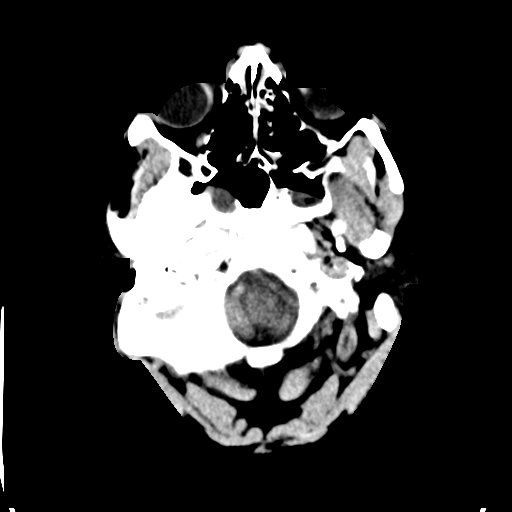
[im 8/34  brain]
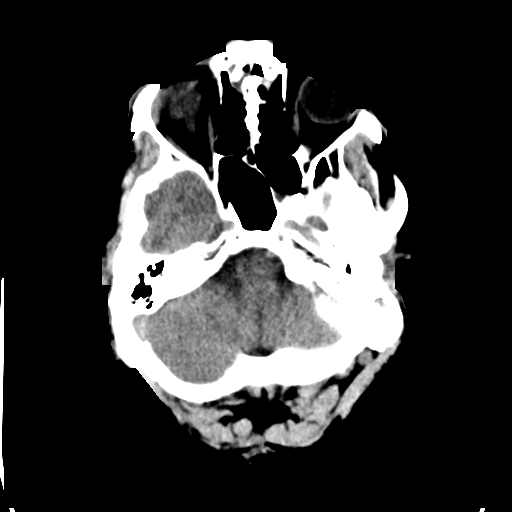
[im 11/34  brain]
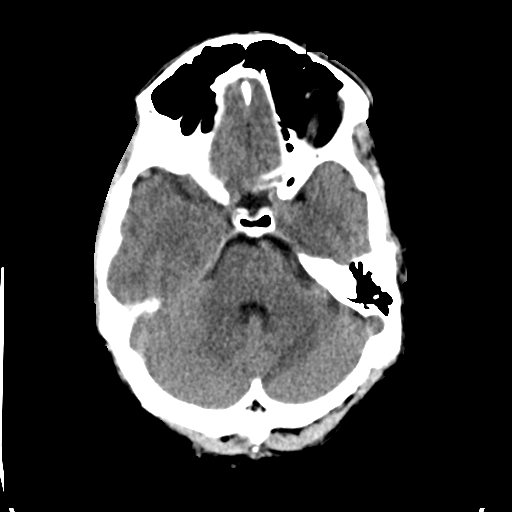
[im 11/34  bone]
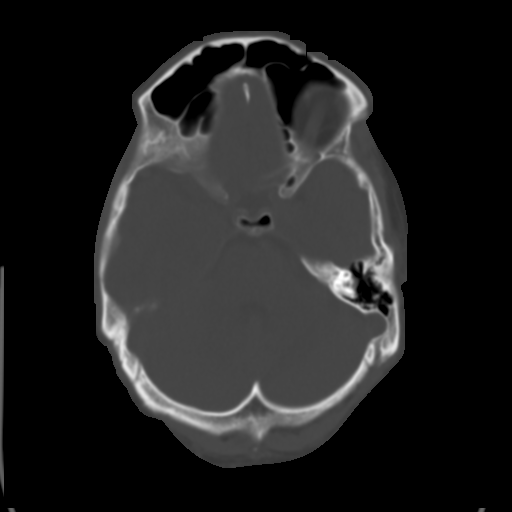
[im 13/34  brain]
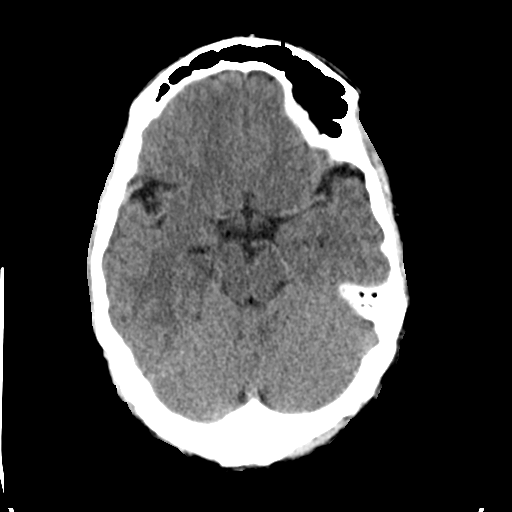
[im 15/34  brain]
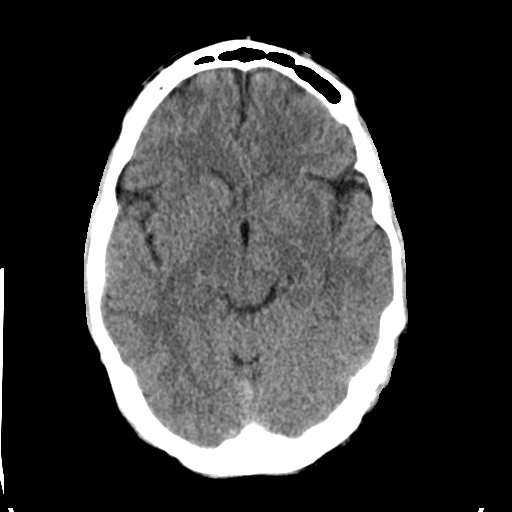
[im 18/34  brain]
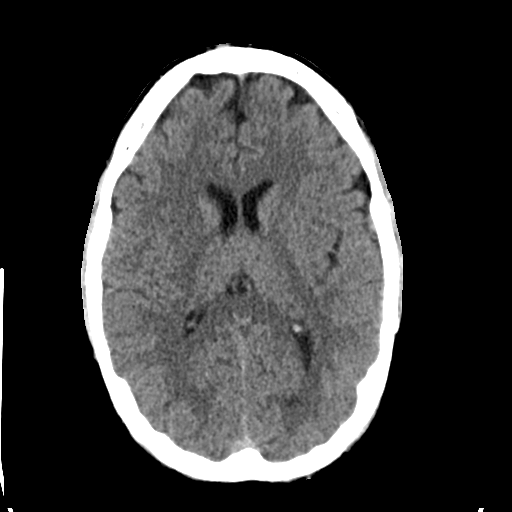
[im 19/34  brain]
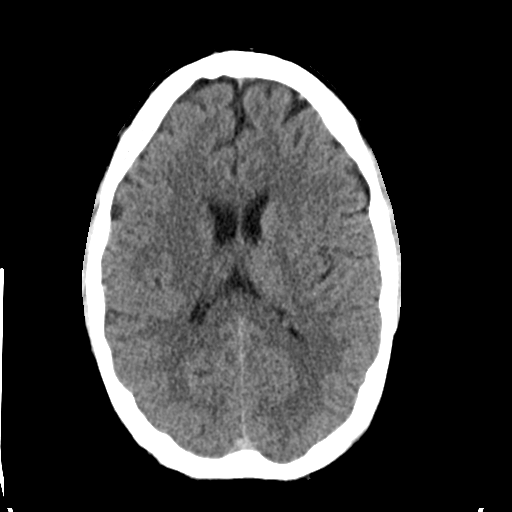
[im 19/34  bone]
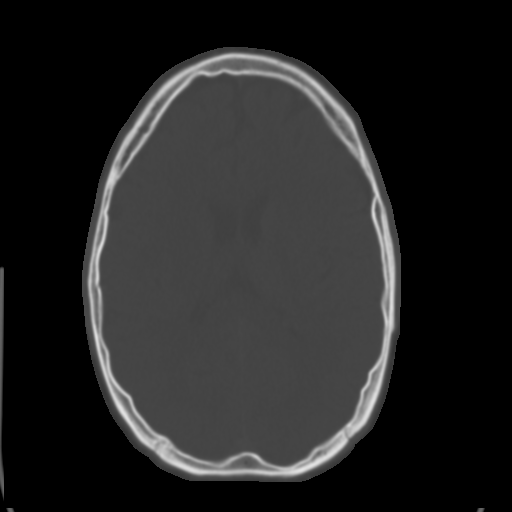
[im 21/34  brain]
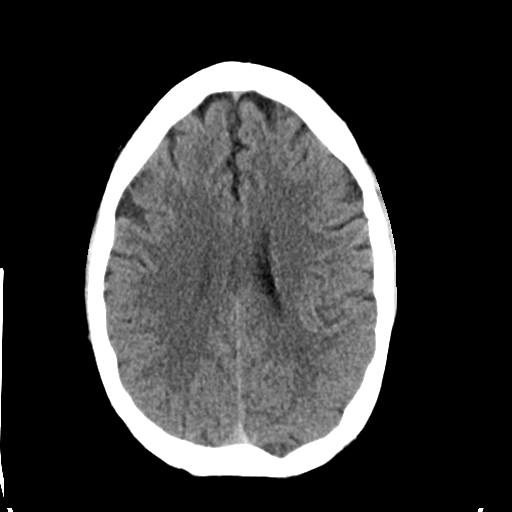
[im 23/34  brain]
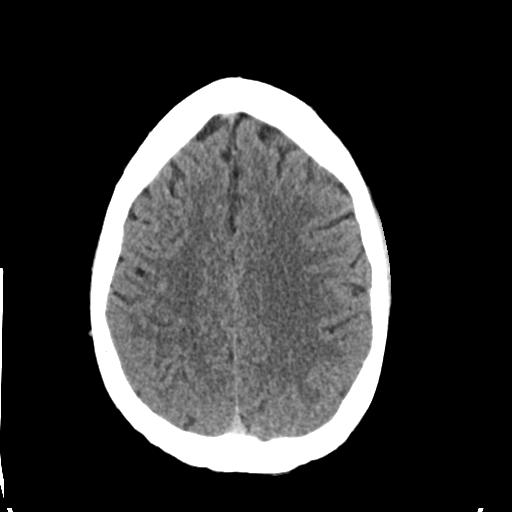
[im 26/34  brain]
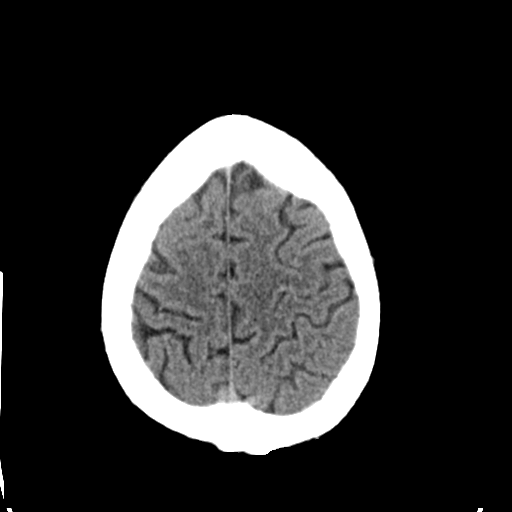
[im 28/34  brain]
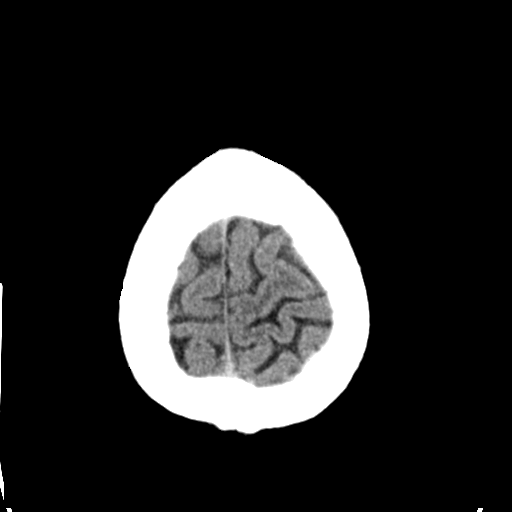
[im 28/34  bone]
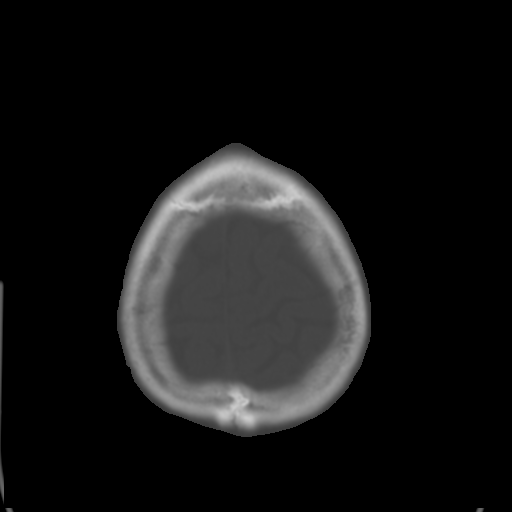
[im 30/34  brain]
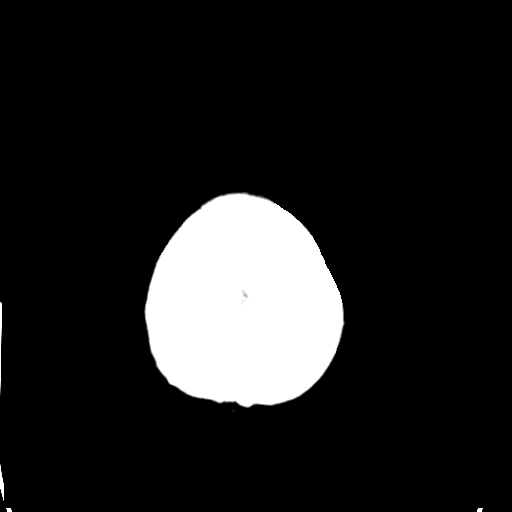
[im 32/34  brain]
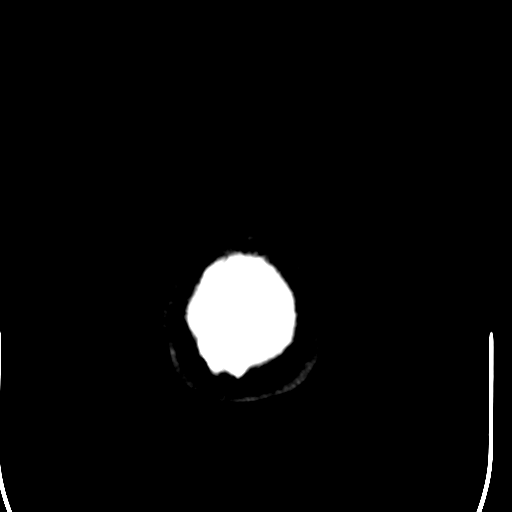

[15 of 30 positions shown; findings below may reference images not displayed]

FINDINGS: There is no evidence of acute infarction, mass lesion, or intra- or
extra-axial hemorrhage on CT.

The posterior fossa, including the cerebellum, brainstem and fourth
ventricle, is within normal limits. The third and lateral
ventricles, and basal ganglia are unremarkable in appearance. The
cerebral hemispheres are symmetric in appearance, with normal
gray-white differentiation. No mass effect or midline shift is seen.

There is no evidence of fracture; visualized osseous structures are
unremarkable in appearance. The orbits are within normal limits.
Mild mucosal thickening is noted in the maxillary sinuses, and there
is partial opacification of the sphenoid sinus. The remaining
paranasal sinuses and mastoid air cells are well-aerated. Soft
tissue swelling is noted about the left ear, and posterior to the
left ear.
IMPRESSION: 1. No acute intracranial pathology seen on CT.
2. Soft tissue swelling about the left ear, and posterior to the
left ear.
3. Mild mucosal thickening at the maxillary sinuses, and partial
opacification of the sphenoid sinus.

## 2015-12-11 ENCOUNTER — Encounter (HOSPITAL_COMMUNITY): Payer: Self-pay | Admitting: Emergency Medicine

## 2015-12-11 ENCOUNTER — Emergency Department (HOSPITAL_COMMUNITY)
Admission: EM | Admit: 2015-12-11 | Discharge: 2015-12-11 | Disposition: A | Discharge status: Home / Self Care | Payer: Medicaid Other | Attending: Emergency Medicine | Admitting: Emergency Medicine

## 2015-12-11 DIAGNOSIS — Z880 Allergy status to penicillin: Secondary | ICD-10-CM | POA: Insufficient documentation

## 2015-12-11 DIAGNOSIS — Z8659 Personal history of other mental and behavioral disorders: Secondary | ICD-10-CM | POA: Insufficient documentation

## 2015-12-11 DIAGNOSIS — Z79899 Other long term (current) drug therapy: Secondary | ICD-10-CM | POA: Insufficient documentation

## 2015-12-11 DIAGNOSIS — F17210 Nicotine dependence, cigarettes, uncomplicated: Secondary | ICD-10-CM | POA: Insufficient documentation

## 2015-12-11 DIAGNOSIS — R569 Unspecified convulsions: Secondary | ICD-10-CM | POA: Insufficient documentation

## 2015-12-11 DIAGNOSIS — Z872 Personal history of diseases of the skin and subcutaneous tissue: Secondary | ICD-10-CM | POA: Insufficient documentation

## 2015-12-11 LAB — CBC WITH DIFFERENTIAL/PLATELET
Basophils Absolute: 0.1 10*3/uL (ref 0.0–0.1)
Basophils Relative: 1 %
Eosinophils Absolute: 0.3 10*3/uL (ref 0.0–0.7)
Eosinophils Relative: 4 %
HEMATOCRIT: 48.2 % (ref 39.0–52.0)
Hemoglobin: 17.3 g/dL — ABNORMAL HIGH (ref 13.0–17.0)
LYMPHS ABS: 2.1 10*3/uL (ref 0.7–4.0)
Lymphocytes Relative: 29 %
MCH: 33.4 pg (ref 26.0–34.0)
MCHC: 35.9 g/dL (ref 30.0–36.0)
MCV: 93.1 fL (ref 78.0–100.0)
MONO ABS: 0.5 10*3/uL (ref 0.1–1.0)
MONOS PCT: 7 %
NEUTROS PCT: 59 %
Neutro Abs: 4.2 10*3/uL (ref 1.7–7.7)
Platelets: 285 10*3/uL (ref 150–400)
RBC: 5.18 MIL/uL (ref 4.22–5.81)
RDW: 12.5 % (ref 11.5–15.5)
WBC: 7.1 10*3/uL (ref 4.0–10.5)

## 2015-12-11 LAB — BASIC METABOLIC PANEL
Anion gap: 6 (ref 5–15)
BUN: 10 mg/dL (ref 6–20)
CALCIUM: 8.8 mg/dL — AB (ref 8.9–10.3)
CHLORIDE: 102 mmol/L (ref 101–111)
CO2: 28 mmol/L (ref 22–32)
CREATININE: 0.64 mg/dL (ref 0.61–1.24)
GFR calc non Af Amer: >60 mL/min (ref >60)
GLUCOSE: 93 mg/dL (ref 65–99)
Potassium: 3.4 mmol/L — ABNORMAL LOW (ref 3.5–5.1)
Sodium: 136 mmol/L (ref 135–145)

## 2015-12-11 LAB — CBG MONITORING, ED: GLUCOSE-CAPILLARY: 82 mg/dL (ref 65–99)

## 2015-12-11 LAB — PHENYTOIN LEVEL, TOTAL: Phenytoin Lvl: 3.9 ug/mL — ABNORMAL LOW (ref 10.0–20.0)

## 2015-12-11 MED ORDER — POTASSIUM CHLORIDE CRYS ER 20 MEQ PO TBCR
40.0000 meq | EXTENDED_RELEASE_TABLET | Freq: Once | ORAL | Status: AC
  Administered 2015-12-11: 40 meq via ORAL
  Filled 2015-12-11: qty 2

## 2015-12-11 MED ORDER — PHENYTOIN SODIUM EXTENDED 100 MG PO CAPS: 100.0000 mg | ORAL_CAPSULE | Freq: Three times a day (TID) | ORAL | Status: AC

## 2015-12-11 MED ORDER — MORPHINE SULFATE (PF) 4 MG/ML IV SOLN
4.0000 mg | Freq: Once | INTRAVENOUS | Status: AC
  Administered 2015-12-11: 4 mg via INTRAVENOUS
  Filled 2015-12-11: qty 1

## 2015-12-11 MED ORDER — MORPHINE SULFATE (PF) 2 MG/ML IV SOLN
2.0000 mg | Freq: Once | INTRAVENOUS | Status: AC
  Administered 2015-12-11: 2 mg via INTRAVENOUS
  Filled 2015-12-11: qty 1

## 2015-12-11 MED ORDER — ONDANSETRON HCL 4 MG/2ML IJ SOLN
4.0000 mg | Freq: Once | INTRAMUSCULAR | Status: AC
  Administered 2015-12-11: 4 mg via INTRAVENOUS
  Filled 2015-12-11: qty 2

## 2015-12-11 MED ORDER — SODIUM CHLORIDE 0.9 % IV SOLN
750.0000 mg | Freq: Once | INTRAVENOUS | Status: AC
  Administered 2015-12-11: 750 mg via INTRAVENOUS
  Filled 2015-12-11: qty 15

## 2015-12-11 MED ORDER — POTASSIUM CHLORIDE ER 20 MEQ PO TBCR: 40.0000 meq | EXTENDED_RELEASE_TABLET | Freq: Every day | ORAL | Status: AC

## 2015-12-11 MED ORDER — KETOROLAC TROMETHAMINE 30 MG/ML IJ SOLN
30.0000 mg | Freq: Once | INTRAMUSCULAR | Status: AC
  Administered 2015-12-11: 30 mg via INTRAVENOUS
  Filled 2015-12-11: qty 1

## 2015-12-11 MED ORDER — SODIUM CHLORIDE 0.9 % IV BOLUS (SEPSIS)
1000.0000 mL | Freq: Once | INTRAVENOUS | Status: AC
  Administered 2015-12-11: 1000 mL via INTRAVENOUS

## 2015-12-11 NOTE — Discharge Instructions (Signed)
You were seen in the ER today for evaluation after a seizure. Your labs showed mildly low potassium levels but were otherwise normal. I will give you prescriptions for a few days of potassium supplements and a one month supply of your phenytoin. Please follow up with primary care as soon as possible, either [place] [facility] [place] or in [place]. Return to the ER for new or worsening symptoms.

## 2015-12-11 NOTE — ED Notes (Signed)
Per EMS pt witnessed seizure per mother on couch in laying position; no injury; currently a/o x4; pt complaint of generalized headache post event. Pt reports out of Dilantin for 2 days.

## 2015-12-11 NOTE — Care Management Note (Signed)
Case Management Note  Patient Details  Name: Joel Randolph MRN: [id number] Date of Birth: 03/29/1991  Subjective/Objective:         Seizures           Action/Plan: Provided pt a [email or web address] coupon for Dilantin. Notified [facility] liaison to arrange appt for ED follow up. [facility] in his community.   Expected Discharge Date:  12/11/2015               Expected Discharge Plan:  Home/Self Care  In-House Referral:  NA  Discharge planning Services  CM Consult  Post Acute Care Choice:  NA Choice offered to:  NA  DME Arranged:  N/A DME Agency:  NA  HH Arranged:  NA HH Agency:  NA  Status of Service:  Completed, signed off  Medicare Important Message Given:    Date Medicare IM Given:    Medicare IM give by:    Date Additional Medicare IM Given:    Additional Medicare Important Message give by:     If discussed at Long Length of Stay Meetings, dates discussed:    Additional Comments:  Elliot CousinShavis, Avy Barlett Ellen, RN 12/11/2015, 6:25 PM

## 2015-12-11 NOTE — ED Provider Notes (Signed)
CSN: [id number]     Arrival date & time 12/11/15  1428 History   First MD Initiated Contact with Patient 12/11/15 1515     Chief Complaint  Patient presents with  . Seizures    HPI   Joel Randolph is an 25 y.o. male with history of seizure disorder, schizophrenia, depression who presents to the ED for evaluation after a seizure. He is accompanied by his mother who witnessed the seizure and provides some of the history. Per pt, he ran out of his home Dilantin 3 days ago due to financial reasons (typically takes  PO TID). He states that today he was sitting on the couch watching TV and the next thing he knew he woke up and his mother told him he had a seizure. Per pt's mother, pt had generalized seizure that lasted for ~5 minutes, identical to his typical seizures. Pt states that he is typically triggered by strobe lights or high pitched noises, but does not remember either of these occurring on TV. Denies pre-seizure aura. States in the ED now he has severe 10/10 diffuse headache which is typical for after a seizure for him. Denies numbness, weakness, tingling. Denies dizziness or feeling faint/lightheaded. Denies nausea. States he does not think he bit his tongue or cheek. Denies chest pain or SOB. Denies EtOH or illicit drug use.  Past Medical History  Diagnosis Date  . Paranoid schizophrenia (HCC)   . Seizure disorder (HCC)   . Bipolar disorder (HCC)   . Depression   . Seizures (HCC)   . Cellulitis of mastoid 10/28/2013   Past Surgical History  Procedure Laterality Date  . Orif wrist fracture     Family History  Problem Relation Age of Onset  . Panic disorder Mother   . Depression Mother   . Depression Father   . Schizophrenia Father   . COPD Father   . Bipolar disorder Father    Social History  Substance Use Topics  . Smoking status: Current Every Day Smoker -- 1.00 packs/day for 10 years    Types: Cigarettes  . Smokeless tobacco: Never Used  . Alcohol Use: No    Review of  Systems  All other systems reviewed and are negative.     Allergies  Amoxicillin; Peanut oil; Penicillins; and Sulfamethoxazole-trimethoprim  Home Medications   Prior to Admission medications   Medication Sig Start Date End Date Taking? Authorizing Provider  albuterol (PROVENTIL HFA;VENTOLIN HFA) 108 (90 BASE) MCG/ACT inhaler Inhale 2 puffs into the lungs every 6 (six) hours as needed. As needed for shortness of breath.   Yes Historical Provider, MD  phenytoin (DILANTIN) 100 MG ER capsule Take 100 mg by mouth 3 (three) times daily.    Yes Historical Provider, MD   BP 121/82 mmHg  Pulse 57  Temp(Src) 98.4 F (36.9 C) (Oral)  Resp 18  SpO2 100% Physical Exam  Constitutional: He is oriented to person, place, and time.  HENT:  Head: Atraumatic.  Right Ear: External ear normal.  Left Ear: External ear normal.  Nose: Nose normal.  Mouth/Throat: Oropharynx is clear and moist. Mucous membranes are dry. No oral lesions. No trismus in the jaw.  Eyes: Conjunctivae and EOM are normal. Pupils are equal, round, and reactive to light.  Neck: Normal range of motion. Neck supple.  Cardiovascular: Normal rate, regular rhythm, normal heart sounds and intact distal pulses.   Pulmonary/Chest: Effort normal and breath sounds normal. No respiratory distress. He has no wheezes. He exhibits no tenderness.  Abdominal: Soft. Bowel sounds are normal. He exhibits no distension. There is no tenderness.  Musculoskeletal: He exhibits no edema or tenderness.  Neurological: He is alert and oriented to person, place, and time. No cranial nerve deficit.  Normal finger to nose, no pronator drift  UE and LE strength and sensation intact and symmetrical bilaterally  Skin: Skin is warm and dry.  Psychiatric: He has a normal mood and affect.  Nursing note and vitals reviewed.  Filed Vitals:   12/11/15 1528 12/11/15 1645 12/11/15 1708 12/11/15 1725  BP: 107/74 106/78 112/81 110/82  Pulse: 53 58 67 66  Temp:       TempSrc:      Resp: 18 16 16 18   Height: 5\' 4"  (1.626 m)     Weight: 55.339 kg     SpO2: 100% 100% 99% 100%     ED Course  Procedures (including critical care time) Labs Review Labs Reviewed  PHENYTOIN LEVEL, TOTAL - Abnormal; Notable for the following:    Phenytoin Lvl 3.9 (*)    All other components within normal limits  BASIC METABOLIC PANEL - Abnormal; Notable for the following:    Potassium 3.4 (*)    Calcium 8.8 (*)    All other components within normal limits  CBC WITH DIFFERENTIAL/PLATELET - Abnormal; Notable for the following:    Hemoglobin 17.3 (*)    All other components within normal limits  CBG MONITORING, ED    Imaging Review No results found. I have personally reviewed and evaluated these images and lab results as part of my medical decision-making.   EKG Interpretation None      MDM   Final diagnoses:  Seizure (HCC)    Will check basic labs and dilantin level. Will load with IV dilantin per pharmacy consult. Fluids and pain meds for headache ordered as well. Pt with nonfocal exam and neurologically intact. Seizure due to being out of home meds. No indication for further neuroimaging at this time. Will consult case management for medication and PCP assistance.  K+ slightly low 3.4. Phenytoin sub therapeutic as expected. Pt loaded with 750mg  dilantin IV. Headache resolved with pain meds. He has remained seizure free his entire ED stay. CM has spoken with pt and provided local resources to establish PCP. However, [place] lives in [place] so he was also instructed to speak with local health dept there for PCP resources. Rx given for few days of k-dur and one month supply phenytoin. ER return precautions given. Pt verbalized his agreement and understanding with plan    Carlene CoriaSerena Y Deyton Ellenbecker, PA-C 12/11/15 1754  Lyndal Pulleyaniel Knott, MD 12/11/15 [phone]

## 2015-12-11 NOTE — Progress Notes (Signed)
MEDICATION RELATED CONSULT NOTE - INITIAL   Pharmacy Consult for Phenytoin Indication: seizure disorder  Allergies  Allergen Reactions  . Amoxicillin Anaphylaxis and Hives    Has patient had a PCN reaction causing immediate rash, facial/tongue/throat swelling, SOB or lightheadedness with hypotension: Yes Has patient had a PCN reaction causing severe rash involving mucus membranes or skin necrosis: Yes Has patient had a PCN reaction that required hospitalization Yes Has patient had a PCN reaction occurring within the last 10 years: No If all of the above answers are "NO", then may proceed with Cephalosporin use.   Marland Kitchen. Peanut Oil Anaphylaxis    NUTS  . Penicillins Anaphylaxis and Hives    Has patient had a PCN reaction causing immediate rash, facial/tongue/throat swelling, SOB or lightheadedness with hypotension: Yes Has patient had a PCN reaction causing severe rash involving mucus membranes or skin necrosis: Yes Has patient had a PCN reaction that required hospitalization Yes Has patient had a PCN reaction occurring within the last 10 years: No If all of the above answers are "NO", then may proceed with Cephalosporin use.   . Sulfamethoxazole-Trimethoprim     ITCHY ING  SHORTNESS OF BREATH       Patient Measurements: Height: 5\' 4"  (162.6 cm) Weight: 122 lb (55.339 kg) IBW/kg (Calculated) : 59.2  Vital Signs: Temp: 98.4 F (36.9 C) (03/04 1436) Temp Source: Oral (03/04 1436) BP: 107/74 mmHg (03/04 1528) Pulse Rate: 53 (03/04 1528) Intake/Output from previous day:   Intake/Output from this shift:    Labs:  Recent Labs  12/11/15 1508  WBC 7.1  HGB 17.3*  HCT 48.2  PLT 285   CrCl cannot be calculated (Patient has no serum creatinine result on file.).  Microbiology: No results found for this or any previous visit (from the past 720 hour(s)).  Medical History: Past Medical History  Diagnosis Date  . Paranoid schizophrenia (HCC)   . Seizure disorder (HCC)   .  Bipolar disorder (HCC)   . Depression   . Seizures (HCC)   . Cellulitis of mastoid 10/28/2013   Medications:  Scheduled:   Infusions:  . phenytoin (DILANTIN) IV     Assessment: PTA Phenytoin 100mg  tid, to ED via ambulance, witnessed seizure at home. Out of Phenytoin for 2 days per ED note. Phenytoin protocol requested, obtained weight for dosing. TBW 55 kg  Goal of Therapy:  Goal Phenytoin level 10-20 mcg/ml  Plan:   Phenytoin 750mg  IV bolus  Await plans for admit or discharge from ED  Otho BellowsGreen, Anea Fodera L PharmD Pager [phone] 12/11/2015, 3:52 PM

## 2015-12-11 NOTE — ED Notes (Signed)
Bed: WA17 Expected date: 12/11/15 Expected time: 2:28 PM Means of arrival: Ambulance Comments: Seizure

## 2015-12-11 NOTE — ED Notes (Signed)
Pt cannot use restroom at this time, aware urine specimen is needed.  

## 2015-12-13 ENCOUNTER — Telehealth: Payer: Self-pay

## 2015-12-13 NOTE — Telephone Encounter (Signed)
Message received from Isidoro DonningAlesia Shavis, RN CM requesting a hospital follow up appointment for the patient. Call placed to the patient and he stated that he needs to check his schedule at work and will call back and schedule his own appointment. He [facility] that [facility]  Update provided to A. Mariea StableShavis, RN CM.

## 2016-04-10 ENCOUNTER — Telehealth: Payer: Self-pay | Admitting: *Deleted

## 2016-04-10 NOTE — Telephone Encounter (Signed)
[facility] 04/10/2016.
